# Patient Record
Sex: Male | Born: 1950
Health system: Southern US, Community
[De-identification: ages and names within clinical notes are randomized; demographics above are authoritative.]

## PROBLEM LIST (undated history)

## (undated) DIAGNOSIS — S46009A Unspecified injury of muscle(s) and tendon(s) of the rotator cuff of unspecified shoulder, initial encounter: Secondary | ICD-10-CM

## (undated) DIAGNOSIS — Z86010 Personal history of colonic polyps: Secondary | ICD-10-CM

## (undated) DIAGNOSIS — M778 Other enthesopathies, not elsewhere classified: Secondary | ICD-10-CM

## (undated) DIAGNOSIS — N341 Nonspecific urethritis: Secondary | ICD-10-CM

## (undated) DIAGNOSIS — M7030 Other bursitis of elbow, unspecified elbow: Secondary | ICD-10-CM

## (undated) HISTORY — DX: Nonspecific urethritis: N34.1

## (undated) HISTORY — PX: COLONOSCOPY: SHX174

## (undated) HISTORY — DX: Personal history of colonic polyps: Z86.010

## (undated) HISTORY — DX: Unspecified injury of muscle(s) and tendon(s) of the rotator cuff of unspecified shoulder, initial encounter: S46.009A

## (undated) HISTORY — DX: Other enthesopathies, not elsewhere classified: M77.8

## (undated) HISTORY — PX: CATARACT EXTRACTION, BILATERAL: SHX1313

---
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
## 2004-02-04 ENCOUNTER — Ambulatory Visit (HOSPITAL_COMMUNITY): Admission: RE | Admit: 2004-02-04 | Discharge: 2004-02-04 | Payer: Self-pay | Admitting: Internal Medicine

## 2004-12-02 ENCOUNTER — Ambulatory Visit: Payer: Self-pay | Admitting: Internal Medicine

## 2005-01-10 ENCOUNTER — Ambulatory Visit: Payer: Self-pay | Admitting: Internal Medicine

## 2005-11-29 ENCOUNTER — Ambulatory Visit: Payer: Self-pay | Admitting: Gastroenterology

## 2005-12-06 ENCOUNTER — Ambulatory Visit: Payer: Self-pay | Admitting: Gastroenterology

## 2006-09-26 ENCOUNTER — Ambulatory Visit: Payer: Self-pay | Admitting: Internal Medicine

## 2006-09-26 LAB — CONVERTED CEMR LAB
ALT: 19 units/L (ref 0–40)
Alkaline Phosphatase: 65 units/L (ref 39–117)
Basophils Absolute: 0.1 10*3/uL (ref 0.0–0.1)
Basophils Relative: 0.9 % (ref 0.0–1.0)
CO2: 25 meq/L (ref 19–32)
Chloride: 107 meq/L (ref 96–112)
GFR calc non Af Amer: 60 mL/min
Glomerular Filtration Rate, Af Am: 73 mL/min/{1.73_m2}
Glucose, Bld: 105 mg/dL — ABNORMAL HIGH (ref 70–99)
Ketones, ur: NEGATIVE mg/dL
LDL Cholesterol: 136 mg/dL — ABNORMAL HIGH (ref 0–99)
Leukocytes, UA: NEGATIVE
Lymphocytes Relative: 23.3 % (ref 12.0–46.0)
MCV: 93 fL (ref 78.0–100.0)
Monocytes Absolute: 0.5 10*3/uL (ref 0.2–0.7)
Monocytes Relative: 6.1 % (ref 3.0–11.0)
Platelets: 248 10*3/uL (ref 150–400)
RBC: 4.42 M/uL (ref 4.22–5.81)
Sodium: 139 meq/L (ref 135–145)
Specific Gravity, Urine: 1.025 (ref 1.000–1.03)
TSH: 1.1 microintl units/mL (ref 0.35–5.50)
Total Protein, Urine: NEGATIVE mg/dL
Total Protein: 6.8 g/dL (ref 6.0–8.3)
Urobilinogen, UA: 0.2 (ref 0.0–1.0)
VLDL: 16 mg/dL (ref 0–40)
WBC: 8.1 10*3/uL (ref 4.5–10.5)
pH: 6 (ref 5.0–8.0)

## 2006-10-09 ENCOUNTER — Ambulatory Visit: Payer: Self-pay | Admitting: Internal Medicine

## 2006-11-06 HISTORY — PX: ROTATOR CUFF REPAIR: SHX139

## 2006-11-08 ENCOUNTER — Encounter: Admission: RE | Admit: 2006-11-08 | Discharge: 2006-11-08 | Payer: Self-pay | Admitting: Internal Medicine

## 2009-07-13 ENCOUNTER — Ambulatory Visit: Payer: Self-pay | Admitting: Internal Medicine

## 2009-07-13 LAB — CONVERTED CEMR LAB
ALT: 26 units/L (ref 0–53)
Albumin: 3.8 g/dL (ref 3.5–5.2)
Basophils Relative: 0.4 % (ref 0.0–3.0)
Bilirubin Urine: NEGATIVE
Bilirubin, Direct: 0.1 mg/dL (ref 0.0–0.3)
CO2: 26 meq/L (ref 19–32)
Chloride: 107 meq/L (ref 96–112)
Creatinine, Ser: 1.3 mg/dL (ref 0.4–1.5)
Eosinophils Absolute: 0.2 10*3/uL (ref 0.0–0.7)
Eosinophils Relative: 3.1 % (ref 0.0–5.0)
HCT: 43.3 % (ref 39.0–52.0)
Hemoglobin, Urine: NEGATIVE
Hemoglobin: 15.1 g/dL (ref 13.0–17.0)
LDL Cholesterol: 124 mg/dL — ABNORMAL HIGH (ref 0–99)
MCHC: 34.7 g/dL (ref 30.0–36.0)
MCV: 94.5 fL (ref 78.0–100.0)
Monocytes Absolute: 0.6 10*3/uL (ref 0.1–1.0)
Neutro Abs: 3.3 10*3/uL (ref 1.4–7.7)
Neutrophils Relative %: 50.8 % (ref 43.0–77.0)
Nitrite: NEGATIVE
Potassium: 4.7 meq/L (ref 3.5–5.1)
RBC: 4.59 M/uL (ref 4.22–5.81)
Sodium: 141 meq/L (ref 135–145)
Total CHOL/HDL Ratio: 5
Total Protein, Urine: NEGATIVE mg/dL
Total Protein: 6.8 g/dL (ref 6.0–8.3)
Triglycerides: 163 mg/dL — ABNORMAL HIGH (ref 0.0–149.0)
Urine Glucose: NEGATIVE mg/dL
Urobilinogen, UA: 0.2 (ref 0.0–1.0)
WBC: 6.4 10*3/uL (ref 4.5–10.5)

## 2009-07-14 ENCOUNTER — Encounter: Payer: Self-pay | Admitting: Internal Medicine

## 2009-07-15 ENCOUNTER — Ambulatory Visit: Payer: Self-pay | Admitting: Internal Medicine

## 2009-07-16 DIAGNOSIS — M67919 Unspecified disorder of synovium and tendon, unspecified shoulder: Secondary | ICD-10-CM | POA: Insufficient documentation

## 2009-07-16 DIAGNOSIS — N341 Nonspecific urethritis: Secondary | ICD-10-CM | POA: Insufficient documentation

## 2009-07-16 DIAGNOSIS — M659 Unspecified synovitis and tenosynovitis, unspecified site: Secondary | ICD-10-CM | POA: Insufficient documentation

## 2009-07-16 DIAGNOSIS — M719 Bursopathy, unspecified: Secondary | ICD-10-CM

## 2009-07-16 DIAGNOSIS — Z872 Personal history of diseases of the skin and subcutaneous tissue: Secondary | ICD-10-CM | POA: Insufficient documentation

## 2009-08-18 ENCOUNTER — Ambulatory Visit: Payer: Self-pay | Admitting: Internal Medicine

## 2009-08-18 DIAGNOSIS — F322 Major depressive disorder, single episode, severe without psychotic features: Secondary | ICD-10-CM | POA: Insufficient documentation

## 2009-08-18 DIAGNOSIS — R51 Headache: Secondary | ICD-10-CM

## 2009-08-18 DIAGNOSIS — R519 Headache, unspecified: Secondary | ICD-10-CM | POA: Insufficient documentation

## 2009-08-20 ENCOUNTER — Encounter: Admission: RE | Admit: 2009-08-20 | Discharge: 2009-08-20 | Payer: Self-pay | Admitting: Internal Medicine

## 2009-08-21 ENCOUNTER — Ambulatory Visit: Payer: Self-pay | Admitting: Family Medicine

## 2009-08-21 DIAGNOSIS — J069 Acute upper respiratory infection, unspecified: Secondary | ICD-10-CM | POA: Insufficient documentation
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
## 2010-11-16 ENCOUNTER — Encounter: Payer: Self-pay | Admitting: Gastroenterology

## 2010-12-08 NOTE — Letter (Signed)
Summary: Colonoscopy Letter  [facility]  [address] [place], [place] [address]   Phone: [phone]  Fax: [phone]      November 16, 2010 MRN: [id number]   Richard Novak [address] [place], [place]  [address]   Dear Mr. Greeno,   According to your medical record, it is time for you to schedule a Colonoscopy. The American Cancer Society recommends this procedure as a method to detect early colon cancer. Patients with a family history of colon cancer, or a personal history of colon polyps or inflammatory bowel disease are at increased risk.  This letter has been generated based on the recommendations made at the time of your procedure. If you feel that in your particular situation this may no longer apply, please contact our office.  Please call our office at [phone] to schedule this appointment or to update your records at your earliest convenience.  Thank you for cooperating with [place] to provide you with the very best care possible.   Sincerely,  Judie Petit T. Russella Dar, M.D.  [facility] [phone]

## 2011-03-24 NOTE — Letter (Signed)
November 14, 2006    Mark C. Ophelia Charter, M.D.  [address]  [place], [place] [address]   RE:  Richard Novak, Richard Novak  MRN:  [id number]  /  DOB:  [birth year]   Dear Loraine Leriche:   Thank you very much for seeing [name] for evaluation of chronic  shoulder pain.   I saw Mr. Plain as a new patient October 09, 2006 and I have enclosed my  new patient H&P.   The patient at that time was complaining of significant pain and  discomfort in his left shoulder.  He had taken a fall back in July of  2006, landing on his left elbow and stoving up his whole right arm and  neck.  Since that time, he has had decrease range of motion with  abduction.  Good range of motion with flexion and extension, but ongoing  pain and discomfort.   The patient came to MRI of his shoulder on November 08, 2006, which  revealed the patient to have significant rotator cuff tendinoplasty  tendinosis with shallow articular tears, but no discrete full-thickness  rotator cuff tear.  He had posterior superior and anterior inferior  labral tears and degeneration, and some mild subacromial or subdeltoid  bursitis.   I appreciate your assistance in evaluation of this nice patient.  If I  can provide any additional information, please do not hesitate to  contact me.   I look forward to hearing from you in regards to a final disposition for  [name].  Thanks for your help.    Sincerely,      Rosalyn Gess. Norins, MD  Electronically Signed    MEN/MedQ  DD: 11/14/2006  DT: 11/14/2006  Job #: [id number]

## 2011-03-24 NOTE — Assessment & Plan Note (Signed)
[facility]                           PRIMARY CARE OFFICE NOTE   Richard Novak, GOETHE                        MRN:          [id number]  DATE:10/09/2006                            DOB:          Jul 11, 1951    Richard Novak is a very pleasant 60 year old Caucasian gentleman who presents  to establish for ongoing continuity of care.  He was last seen for a  physical exam on January 10, 2005.  Please see that complete dictation for  past medical history, family history and social history.  The patient  was seen September 26, 2006 for a sinus infection and treated with  azithromycin with good results.   CHIEF COMPLAINTS:  1. Shoulder pain.  The patient reports he took a fall in July of 2006      and fell, landing on his left elbow and stoving up his whole      right arm and neck.  Since that time, he has had decreased range of      motion with abduction, although he has good range of motion with      flexion and extension.  He also has some limitation in rotation of      his neck, particularly to the left.  2. Increased urinary odor and color.  3. Development of a tan skin lesion in the left infraorbital area, as      well as multiple tan lesions, particularly on his lower      extremities.   CURRENT MEDICATIONS:  No prescription drugs on a regular basis.  1. He does take Levitra 10 mg p.r.n.  2. Tylenol on a p.r.n. basis.  3. Advil on a p.r.n. basis.   REVIEW OF SYSTEMS:  Negative for any constitutional problems, except for  mild sleep duration insomnia, which occurs 2-3 times a month.  No  opthalmic problems, but no exam for more than 24 months.  No ENT,  cardiovascular or respiratory problems.  Occasional heartburn treated  with Pepcid AC.  Occasional hemorrhoids, but no active bleeding.  No GU  problems, except for occasional nocturia x1-2, and no musculoskeletal,  dermatologic or neurologic complaints.   PHYSICAL EXAMINATION:  VITAL SIGNS:  The patient is  afebrile.  Blood  pressure 113/73, pulse 76, weight 201.  GENERAL APPEARANCE.  This is a well-nourished, well-developed Caucasian  gentleman who looks his stated age, in no acute distress.  HEENT:  Normocephalic and atraumatic.  EACs and TMs were unremarkable.  Oropharynx with native dentition in good repair.  No buccal or palatal  lesions were noted.  Posterior pharynx was clear.  Conjunctivae and  sclerae were clear.  Pupils equal, round and reactive to light and  accommodation.  Extraocular movements intact.  Funduscopic exam was  unremarkable.  NECK:  Supple without thyromegaly.  NODES:  No adenopathy was noted in the cervical, supraclavicular or  axillary regions.  CHEST:  No CVA tenderness.  LUNGS:  Clear to auscultation and percussion.  CARDIOVASCULAR:  There were 2+ radial pulses.  No JVD or carotid bruits.  He had a quiet precordium with a  regular rate and rhythm without  murmurs, rubs, or gallops.  ABDOMEN:  Soft.  No guarding or rebound.  No organosplenomegaly was  noted.  There was no guarding, no rebound, no tenderness.  GENITALIA:  Normal male phallus bilaterally.  Descended testicles  without masses.  RECTAL:  Normal sphincter tone was noted.  Prostate was smooth, normal  in size and contour without nodules.  EXTREMITIES:  Without clubbing, cyanosis, edema or deformity.  NEUROLOGIC:  No neurologic abnormalities were noted.   DATABASE:  A 12-lead electrocardiogram was a normal sinus rhythm with no  abnormalities noted.   LABORATORY DATA:  Hemoglobin was 14 gm.  White count was 8100 with a  normal differential.  Chemistries were unremarkable with a serum glucose  of 105.  Kidney function normal with a creatinine of 1.3 and a GFR of  60.  Liver functions were normal.  Cholesterol was 191, triglycerides  80, HDL 38.8, LDL 136.  Thyroid function normal with a TSH of 1.10.  PSA  was normal at 0.57.  Urinalysis was slightly concentrated with a  specific gravity of  1.025.   ASSESSMENT AND PLAN:  1. Concentrated urine.  Suspect the patient is just mildly behind in      fluid intake with no need for any further evaluation.  2. Musculoskeletal.  On examination, the patient did have limitation      in abduction to 90 degrees, then limited by pain and discomfort.      There is no crepitus in the joint.  Would be concerned for possible      torn rotator cuff.  Plan - the patient to have an MRI of the      shoulder, and if there is no derangement, would refer to physical      therapy.  If there is derangement, would refer to orthopedics.  3. Dermatologic.  The patient has a macular tan lesion in the      infraorbital area on the left which does not appear worrisome.      This needs to just be watched for change in size or color.  Other      skin lesions were also noted to be benign macular-appearing      lesions.  4. Health maintenance.  The patient did undergo colonoscopy in December 06, 2005, which was a normal study, except for internal      hemorrhoids, with the patient due to a followup study in 2012.  The      patient's last chest x-ray on February 04, 2004 showed no evidence of      active chest disease, and the patient is a nonsmoker.   In summary, this is a very pleasant gentleman with shoulder problems as  noted above; otherwise, in good health.  He is asked to return to see me  in 2 years for a routine physical exam; otherwise, I will see him on a  p.r.n. basis.     Rosalyn Gess Norins, MD  Electronically Signed    MEN/MedQ  DD: 10/10/2006  DT: 10/10/2006  Job #: [id number]   cc:   [name]

## 2011-05-12 ENCOUNTER — Encounter: Payer: Self-pay | Admitting: Internal Medicine

## 2011-05-15 ENCOUNTER — Ambulatory Visit (INDEPENDENT_AMBULATORY_CARE_PROVIDER_SITE_OTHER): Payer: PRIVATE HEALTH INSURANCE | Admitting: Internal Medicine

## 2011-05-15 VITALS — BP 112/80 | HR 104 | Temp 98.5°F | Wt 198.0 lb

## 2011-05-15 DIAGNOSIS — S76219A Strain of adductor muscle, fascia and tendon of unspecified thigh, initial encounter: Secondary | ICD-10-CM

## 2011-05-15 DIAGNOSIS — IMO0002 Reserved for concepts with insufficient information to code with codable children: Secondary | ICD-10-CM

## 2011-05-15 NOTE — Progress Notes (Signed)
  Subjective:    Patient ID: Richard Novak, male    DOB: April 21, 1951, 60 y.o.   MRN: [id number]  HPI Richard Novak presents for a several day h/o pain in the medial thigh, testicle and groin. He has been doing some lifting. The pain has migrated so that it is now in the lower abdomen. His concerns are testicular disease vs hernia.  PMH, FamHx and SocHx reviewed for any changes and relevance.    Review of Systems  Constitutional: Negative.   HENT: Negative.  Negative for facial swelling.   Eyes: Negative.   Respiratory: Negative.   Cardiovascular: Negative.   Genitourinary: Negative.  Negative for penile swelling, scrotal swelling and testicular pain.  Musculoskeletal: Negative for joint swelling.  Neurological: Negative.   Hematological: Negative.        Objective:   Physical Exam Vitals reviewed Gen'l - WNWD white male Ext - left thigh normal with no point tenderness or mass GU - testicles slightly atrophic, normal structure, no tenderness or scrotal mass.  Abd - no mass or  Bulge in the left groin or left inguinal canal.       Assessment & Plan:  Groin strain - no testicular findings no hernia  Plan - supportive care.

## 2011-06-16 ENCOUNTER — Telehealth: Payer: Self-pay

## 2011-06-16 DIAGNOSIS — G8929 Other chronic pain: Secondary | ICD-10-CM

## 2011-06-16 NOTE — Telephone Encounter (Signed)
Pt called stating he is still having lower abd pain, worse when sitting. Pt is requesting abd U/S. He has a strong family history of cancer and is extremely anxious to rule out, please advise.

## 2011-06-19 NOTE — Telephone Encounter (Signed)
For concern about interabdominal issues like malignancy will need CT abdomen/pelvis with contrast. If he is willing will schedule. May have a significant co-pay.

## 2011-06-19 NOTE — Telephone Encounter (Signed)
Left message on machine for pt to return my call regarding CT.

## 2011-06-20 ENCOUNTER — Ambulatory Visit: Payer: PRIVATE HEALTH INSURANCE

## 2011-06-20 DIAGNOSIS — Z0389 Encounter for observation for other suspected diseases and conditions ruled out: Secondary | ICD-10-CM

## 2011-06-20 DIAGNOSIS — Z0000 Encounter for general adult medical examination without abnormal findings: Secondary | ICD-10-CM

## 2011-06-20 LAB — URINALYSIS
Nitrite: NEGATIVE
Total Protein, Urine: NEGATIVE
Urine Glucose: NEGATIVE
pH: 8 (ref 5.0–8.0)

## 2011-06-20 LAB — CBC WITH DIFFERENTIAL/PLATELET
Basophils Relative: 0.6 % (ref 0.0–3.0)
Eosinophils Absolute: 0.2 10*3/uL (ref 0.0–0.7)
Eosinophils Relative: 3.2 % (ref 0.0–5.0)
Lymphocytes Relative: 29.1 % (ref 12.0–46.0)
Monocytes Relative: 7.1 % (ref 3.0–12.0)
Neutrophils Relative %: 60 % (ref 43.0–77.0)
RBC: 4.94 Mil/uL (ref 4.22–5.81)
WBC: 6.1 10*3/uL (ref 4.5–10.5)

## 2011-06-20 LAB — BASIC METABOLIC PANEL
BUN: 19 mg/dL (ref 6–23)
Chloride: 102 mEq/L (ref 96–112)
Potassium: 4.9 mEq/L (ref 3.5–5.1)

## 2011-06-20 LAB — LIPID PANEL
Total CHOL/HDL Ratio: 4
VLDL: 13.4 mg/dL (ref 0.0–40.0)

## 2011-06-20 LAB — HEPATIC FUNCTION PANEL
AST: 22 U/L (ref 0–37)
Alkaline Phosphatase: 54 U/L (ref 39–117)
Total Bilirubin: 0.6 mg/dL (ref 0.3–1.2)

## 2011-06-20 LAB — PSA: PSA: 0.62 ng/mL (ref 0.10–4.00)

## 2011-06-20 LAB — LDL CHOLESTEROL, DIRECT: Direct LDL: 143.4 mg/dL

## 2011-06-20 NOTE — Telephone Encounter (Signed)
Pt came into the office this morning. He is aware that he will have a high deductable. Pt would like to go ahead and set up CT of Abdomin and pelvis

## 2011-06-20 NOTE — Telephone Encounter (Signed)
Patient notified per MD and advised [facility] to contact with appt information

## 2011-06-20 NOTE — Telephone Encounter (Signed)
K. Order entered

## 2011-06-26 ENCOUNTER — Ambulatory Visit (INDEPENDENT_AMBULATORY_CARE_PROVIDER_SITE_OTHER)
Admission: RE | Admit: 2011-06-26 | Discharge: 2011-06-26 | Disposition: A | Discharge status: Home / Self Care | Payer: PRIVATE HEALTH INSURANCE | Source: Ambulatory Visit | Attending: Internal Medicine | Admitting: Internal Medicine

## 2011-06-26 DIAGNOSIS — R109 Unspecified abdominal pain: Secondary | ICD-10-CM

## 2011-06-26 DIAGNOSIS — G8929 Other chronic pain: Secondary | ICD-10-CM

## 2011-06-26 MED ORDER — IOHEXOL 300 MG/ML  SOLN
100.0000 mL | Freq: Once | INTRAMUSCULAR | Status: AC | PRN
  Administered 2011-06-26: 100 mL via INTRAVENOUS

## 2011-06-27 ENCOUNTER — Ambulatory Visit (INDEPENDENT_AMBULATORY_CARE_PROVIDER_SITE_OTHER): Payer: PRIVATE HEALTH INSURANCE | Admitting: Internal Medicine

## 2011-06-27 ENCOUNTER — Encounter: Payer: Self-pay | Admitting: Internal Medicine

## 2011-06-27 VITALS — BP 104/64 | HR 104 | Temp 97.7°F | Wt 197.0 lb

## 2011-06-27 DIAGNOSIS — Z136 Encounter for screening for cardiovascular disorders: Secondary | ICD-10-CM

## 2011-06-27 DIAGNOSIS — R7989 Other specified abnormal findings of blood chemistry: Secondary | ICD-10-CM

## 2011-06-27 DIAGNOSIS — Z0000 Encounter for general adult medical examination without abnormal findings: Secondary | ICD-10-CM
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
## 2011-06-29 DIAGNOSIS — Z0000 Encounter for general adult medical examination without abnormal findings: Secondary | ICD-10-CM | POA: Insufficient documentation

## 2011-06-29 NOTE — Assessment & Plan Note (Addendum)
Interval medical history significant for lower abdominal discomfort with normal lab and a negative CT abdomen/pelvis. Physical exam is normal with no findings to explain his discomfort. Lab results are in normal limits except for a mildly elevated LDL cholesterol at 146, previously 124. This can be addressed by life-style management with a low fat diet and regular exercise. Follow-up lab in 6 months. He is current with colorectal cancer screening with last exam in Jan '07. Immunizations: due for tetanu, pneumonia and shingles shots. 12 lead EKG reveals mild tachycardia with no evidence of ischemia or injury. PSA was normal.  In summary - a pleasant gentleman who appears to be in good health. He will return for lab in 6 months to monitor cholesterol levels. He will return if his abdominal symptoms progress, otherwise he will return in 1 year.

## 2012-07-22 ENCOUNTER — Encounter: Payer: Self-pay | Admitting: Gastroenterology

## 2013-02-27 ENCOUNTER — Encounter: Payer: Self-pay | Admitting: Internal Medicine

## 2013-03-11 ENCOUNTER — Encounter: Payer: PRIVATE HEALTH INSURANCE | Admitting: Internal Medicine

## 2013-03-14 ENCOUNTER — Ambulatory Visit (AMBULATORY_SURGERY_CENTER): Payer: PRIVATE HEALTH INSURANCE

## 2013-03-14 VITALS — Ht 71.0 in | Wt 216.6 lb

## 2013-03-14 DIAGNOSIS — Z1211 Encounter for screening for malignant neoplasm of colon: Secondary | ICD-10-CM

## 2013-03-14 DIAGNOSIS — Z86010 Personal history of colonic polyps: Secondary | ICD-10-CM

## 2013-03-14 MED ORDER — NA SULFATE-K SULFATE-MG SULF 17.5-3.13-1.6 GM/177ML PO SOLN: 1.0000 | Freq: Once | ORAL | Status: DC

## 2013-03-17 ENCOUNTER — Encounter: Payer: Self-pay | Admitting: Internal Medicine

## 2013-03-18 ENCOUNTER — Encounter: Payer: Self-pay | Admitting: Internal Medicine

## 2013-03-18 ENCOUNTER — Ambulatory Visit (AMBULATORY_SURGERY_CENTER): Payer: PRIVATE HEALTH INSURANCE | Admitting: Internal Medicine

## 2013-03-18 VITALS — BP 126/62 | HR 73 | Temp 97.7°F | Resp 17 | Ht 71.0 in | Wt 216.0 lb

## 2013-03-18 DIAGNOSIS — K5730 Diverticulosis of large intestine without perforation or abscess without bleeding: Secondary | ICD-10-CM

## 2013-03-18 DIAGNOSIS — D126 Benign neoplasm of colon, unspecified: Secondary | ICD-10-CM

## 2013-03-18 DIAGNOSIS — Z860100 Personal history of colon polyps, unspecified: Secondary | ICD-10-CM | POA: Insufficient documentation

## 2013-03-18 DIAGNOSIS — K648 Other hemorrhoids: Secondary | ICD-10-CM

## 2013-03-18 DIAGNOSIS — K644 Residual hemorrhoidal skin tags: Secondary | ICD-10-CM

## 2013-03-18 DIAGNOSIS — Z1211 Encounter for screening for malignant neoplasm of colon: Secondary | ICD-10-CM

## 2013-03-18 HISTORY — DX: Personal history of colonic polyps: Z86.010

## 2013-03-18 HISTORY — DX: Personal history of colon polyps, unspecified: Z86.0100

## 2013-03-18 MED ORDER — SODIUM CHLORIDE 0.9 % IV SOLN: 500.0000 mL | INTRAVENOUS | Status: DC

## 2013-03-18 NOTE — Progress Notes (Signed)
Called to room to assist during endoscopic procedure.  Patient ID and intended procedure confirmed with present staff. Received instructions for my participation in the procedure from the performing physician.  

## 2013-03-18 NOTE — Progress Notes (Signed)
Patient did not experience any of the following events: a burn prior to discharge; a fall within the facility; wrong site/side/patient/procedure/implant event; or a hospital transfer or hospital admission upon discharge from the facility. (G8907) Patient did not have preoperative order for IV antibiotic SSI prophylaxis. (G8918)  

## 2013-03-18 NOTE — Patient Instructions (Addendum)
Two benign appearing polyps were removed.  You also have diverticulosis and hemorrhoids.  I will let you know pathology results and when to have another routine colonoscopy by mail.  If your hemorrhoids bother you please contact me through my office - I can help you with those with a procedure called hemorrhoid ligation - usually painless and quick procedure.  I appreciate the opportunity to care for you.  Iva Boop, MD, FACG   YOU HAD AN ENDOSCOPIC PROCEDURE TODAY AT THE [facility]: Refer to the procedure report that was given to you for any specific questions about what was found during the examination.  If the procedure report does not answer your questions, please call your gastroenterologist to clarify.  If you requested that your care partner not be given the details of your procedure findings, then the procedure report has been included in a sealed envelope for you to review at your convenience later.  YOU SHOULD EXPECT: Some feelings of bloating in the abdomen. Passage of more gas than usual.  Walking can help get rid of the air that was put into your GI tract during the procedure and reduce the bloating. If you had a lower endoscopy (such as a colonoscopy or flexible sigmoidoscopy) you may notice spotting of blood in your stool or on the toilet paper. If you underwent a bowel prep for your procedure, then you may not have a normal bowel movement for a few days.  DIET: Your first meal following the procedure should be a light meal and then it is ok to progress to your normal diet.  A half-sandwich or bowl of soup is an example of a good first meal.  Heavy or fried foods are harder to digest and may make you feel nauseous or bloated.  Likewise meals heavy in dairy and vegetables can cause extra gas to form and this can also increase the bloating.  Drink plenty of fluids but you should avoid alcoholic beverages for 24 hours.  ACTIVITY: Your care partner should take you  home directly after the procedure.  You should plan to take it easy, moving slowly for the rest of the day.  You can resume normal activity the day after the procedure however you should NOT DRIVE or use heavy machinery for 24 hours (because of the sedation medicines used during the test).    SYMPTOMS TO REPORT IMMEDIATELY: A gastroenterologist can be reached at any hour.  During normal business hours, 8:30 AM to 5:00 PM Monday through Friday, call [phone].  After hours and on weekends, please call the GI answering service at [phone] who will take a message and have the physician on call contact you.   Following lower endoscopy (colonoscopy or flexible sigmoidoscopy):  Excessive amounts of blood in the stool  Significant tenderness or worsening of abdominal pains  Swelling of the abdomen that is new, acute  Fever of 100F or higher   FOLLOW UP: If any biopsies were taken you will be contacted by phone or by letter within the next 1-3 weeks.  Call your gastroenterologist if you have not heard about the biopsies in 3 weeks.  Our staff will call the home number listed on your records the next business day following your procedure to check on you and address any questions or concerns that you may have at that time regarding the information given to you following your procedure. This is a courtesy call and so if there is no answer at the  home number and we have not heard from you through the emergency physician on call, we will assume that you have returned to your regular daily activities without incident.  SIGNATURES/CONFIDENTIALITY: You and/or your care partner have signed paperwork which will be entered into your electronic medical record.  These signatures attest to the fact that that the information above on your After Visit Summary has been reviewed and is understood.  Full responsibility of the confidentiality of this discharge information lies with you and/or your  care-partner.  Please hold aspirin, aspirin products, and anti-inflammatory meds for one week.    Gave polyp, diverticulosis, hemorrhoid information.

## 2013-03-18 NOTE — Op Note (Signed)
[facility] [address] [place], [address]   COLONOSCOPY PROCEDURE REPORT  PATIENT: Richard Novak, Richard Novak  MR#: [id number] BIRTHDATE: May 13, 1951 , 61  yrs. old GENDER: Male ENDOSCOPIST: Iva Boop, MD, [facility] PROCEDURE DATE:  03/18/2013 PROCEDURE:   Colonoscopy with snare polypectomy ASA CLASS:   Class II INDICATIONS:Screening and surveillance,personal history of colonic polyps. MEDICATIONS: propofol (Diprivan) 350mg  IV, MAC sedation, administered by CRNA, and These medications were titrated to patient response per physician's verbal order  DESCRIPTION OF PROCEDURE:   After the risks benefits and alternatives of the procedure were thoroughly explained, informed consent was obtained.  A digital rectal exam revealed no abnormalities of the rectum, A digital rectal exam revealed the prostate was not enlarged, and A digital rectal exam revealed no prostatic nodules.   The LB CF-[id number]  endoscope was introduced through the anus and advanced to the cecum, which was identified by both the appendix and ileocecal valve. No adverse events experienced.   The quality of the prep was excellent using Suprep  The instrument was then slowly withdrawn as the colon was fully examined.    COLON FINDINGS: Two polypoid shaped polyps measuring 5 mm (sessile) and 8-10 mm (pedunculated)in size were found in the sigmoid colon. A polypectomy was performed with a cold snare (5mm) and using snare cautery (8-55mm).  The resection was complete and the polyp tissue was completely retrieved.   Moderate diverticulosis was noted in the sigmoid colon.   Small internal and external hemorrhoids were found.   The colon mucosa was otherwise normal.  Retroflexed views revealed internal/external hemorrhoids. The time to cecum=2 minutes 22 seconds.  Withdrawal time=12 minutes 27 seconds.  The scope was withdrawn and the procedure completed. COMPLICATIONS: There were no  complications.  ENDOSCOPIC IMPRESSION: 1.   Two pedunculated polyps measuring 8-10 mm in size were found in the sigmoid colon; polypectomy was performed with a cold snare and using snare cautery 2.   Moderate diverticulosis was noted in the sigmoid colon 3.   Small internal and external hemorrhoids 4.   The colon mucosa was otherwise normal - excellent prep  RECOMMENDATIONS: 1.  Timing of repeat colonoscopy will be determined by pathology findings in patient w/ 3 diminutive adenomas 2004 and none in 2007 2.  Hold aspirin, aspirin products, and anti-inflammatory medication for 1 week.  eSigned:  Iva Boop, MD, [facility] 03/18/2013 9:07 AM   cc: The Patient

## 2013-03-19 ENCOUNTER — Telehealth: Payer: Self-pay | Admitting: *Deleted

## 2013-03-19 NOTE — Telephone Encounter (Signed)
  Follow up Call-  Call back number 03/18/2013  Post procedure Call Back phone  # [phone]  Permission to leave phone message Yes     Patient questions:  Do you have a fever, pain , or abdominal swelling? no Pain Score  0 *  Have you tolerated food without any problems? yes  Have you been able to return to your normal activities? yes  Do you have any questions about your discharge instructions: Diet   no Medications  no Follow up visit  no  Do you have questions or concerns about your Care? no  Actions: * If pain score is 4 or above: No action needed, pain <4.

## 2013-03-24 ENCOUNTER — Encounter: Payer: Self-pay | Admitting: Internal Medicine

## 2013-03-24 NOTE — Progress Notes (Signed)
Quick Note:  8mm TV adenoma and a 5 mm hyperplastic polyp (both sigmoid) Repeat colonoscopy 5 years ______

## 2014-01-08 ENCOUNTER — Ambulatory Visit (INDEPENDENT_AMBULATORY_CARE_PROVIDER_SITE_OTHER): Payer: Self-pay | Admitting: Internal Medicine

## 2014-01-08 ENCOUNTER — Encounter: Payer: Self-pay | Admitting: Internal Medicine

## 2014-01-08 VITALS — BP 114/70 | HR 116 | Temp 99.9°F | Wt 217.0 lb

## 2014-01-08 DIAGNOSIS — J019 Acute sinusitis, unspecified: Secondary | ICD-10-CM

## 2014-01-08 DIAGNOSIS — H6120 Impacted cerumen, unspecified ear: Secondary | ICD-10-CM

## 2014-01-08 MED ORDER — AMOXICILLIN-POT CLAVULANATE 875-125 MG PO TABS: 1.0000 | ORAL_TABLET | Freq: Two times a day (BID) | ORAL | Status: DC

## 2014-01-08 NOTE — Progress Notes (Signed)
Pre visit review using our clinic review tool, if applicable. No additional management support is needed unless otherwise documented below in the visit note. 

## 2014-01-08 NOTE — Progress Notes (Signed)
Subjective:    Patient ID: Richard Novak, male    DOB: 1951/06/17, 63 y.o.   MRN: [id number]  HPI Two sweeks ago he had a scratchy throat which progressed to full blown sore throat with swollen glands. Started otc chloraseptic and APAP, ibuprofen. 1 week ago started sudafed but it made it worse with pink mucus. He feels it in his ears. He has had intermittent fevers.  Past Medical History  Diagnosis Date  . Rotator cuff injury     Left  . Tendinitis of left elbow   . Urticaria   . Nongonococcal urethritis (NGU) due to other specified organism   . Personal history of colonic adenomas 03/18/2013   Past Surgical History  Procedure Laterality Date  . Appendectomy  1968  . Rotator cuff repair  2008    Arthroscopic Repair ([facility])   Family History  Problem Relation Age of Onset  . Hypertension Mother   . Dementia Mother   . Prostate cancer Father   . Coronary artery disease Father   . Cancer Father     lung cancer  . Heart disease Father     CAD/CABG  . Diabetes Neg Hx   . Heart attack Neg Hx   . Colon cancer Neg Hx   . Hyperlipidemia Neg Hx    History   Social History  . Marital Status: Married    Spouse Name: N/A    Number of Children: 2  . Years of Education: 14   Occupational History  . 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     Social History Main Topics  . Smoking status: Former Smoker    Types: Cigarettes    Quit date: 06/26/2002  . Smokeless tobacco: Never Used  . Alcohol Use: 2.5 oz/week    5 drink(s) per week  . Drug Use: No  . Sexual Activity: Yes    Partners: Female   Other Topics Concern  . Not on file   Social History Narrative   HSG, 2 yrs [employer]. Married '71. 2 daughters - '74, '77; 2 grandchildren. Marriage in [place]. Retired -but working part-time for [employer]. POA wife. Full resuscitation, short-term mechanical ventilation.                 Current  Outpatient Prescriptions on File Prior to Visit  Medication Sig Dispense Refill  . B Complex-Biotin-FA (B-50 COMPLEX PO) Take by mouth.        . calcium gluconate 500 MG tablet Take 500 mg by mouth daily.        . Cholecalciferol (VITAMIN D-3 PO) Take 50 mcg by mouth daily.      . Flax Oil-Fish Oil-Borage Oil (FISH OIL-FLAX OIL-BORAGE OIL) CAPS Take by mouth. Take 2 capsules bid      . L-ARGININE PO Take 900 mg by mouth.      . LECITHIN PO Take 400 mg by mouth. Take 3 capsules daily      . MULTIPLE VITAMIN PO Take 1 tablet by mouth daily.        . NON FORMULARY Beta Sitosterol with Zinc and Selenium-320/15/200 mg Take one daily      . NON FORMULARY Herbal blend for prostate-Take one capsule bid      . vitamin C (ASCORBIC ACID) 500 MG tablet Take 500 mg by mouth 2 (two) times daily.       No current  facility-administered medications on file prior to visit.      Review of Systems System review is negative for any constitutional, cardiac, pulmonary, GI or neuro symptoms or complaints other than as described in the HPI.     Objective:   Physical Exam Filed Vitals:   01/08/14 1132  BP: 114/70  Pulse: 116  Temp: 99.9 F (37.7 C)   Gen'l- WNWD man in no distress HEENT - EACs clear, TMs a little rosy, Posterior pharynx with erythema w/o exudate. Nodes - negative Cor - RRR Pulm - normal respirations, w/o wheezing or rales. Neuro - A&O x 3.        Assessment & Plan:  Sinus infection with fever and pain.  Plan  Augmentin 875 mg twice a day for 7 days  Nasal saline spray  Vaporizer - stove top steam  OK to continue the sudafed if it helps  OK to use Tylenol for pain   Ear wax - left ear is blocked.  Plan - Ear wax removal aide - drops, put in ear, let dwell 10 minutes  Using a bulb syringe irrigate with warm water.  No Q-tips - this will pack the wax in tighter.

## 2014-01-08 NOTE — Patient Instructions (Signed)
Sinus infection with fever and pain.  Plan  Augmentin 875 mg twice a day for 7 days  Nasal saline spray  Vaporizer - stove top steam  OK to continue the sudafed if it helps  OK to use Tylenol for pain   Ear wax - left ear is blocked.  Plan - Ear wax removal aide - drops, put in ear, let dwell 10 minutes  Using a bulb syringe irrigate with warm water.  No Q-tips - this will pack the wax in tighter.

## 2014-01-17 ENCOUNTER — Other Ambulatory Visit: Payer: Self-pay | Admitting: Internal Medicine

## 2014-01-19 NOTE — Telephone Encounter (Signed)
Called patient - still with intermittent fevers, sinus pressure, drainage more bright yellow, no rigors  Plan Renew augmentin  If not cleared will need reevaluation with exam and possibly imaging.

## 2015-06-08 ENCOUNTER — Telehealth: Payer: Self-pay

## 2015-06-08 NOTE — Telephone Encounter (Signed)
Left VM regarding AWV; Stated that this appears to be his 1st year on Medicare, if he is on medicare and that this will be his first Welcome to Medicare visit and this will be done by Terri Piedra at his scheduled apt at 8:30

## 2015-06-09 ENCOUNTER — Other Ambulatory Visit (INDEPENDENT_AMBULATORY_CARE_PROVIDER_SITE_OTHER): Payer: Medicare Other

## 2015-06-09 ENCOUNTER — Ambulatory Visit (INDEPENDENT_AMBULATORY_CARE_PROVIDER_SITE_OTHER): Payer: Medicare Other | Admitting: Family

## 2015-06-09 ENCOUNTER — Encounter: Payer: Self-pay | Admitting: Family

## 2015-06-09 VITALS — BP 102/60 | HR 68 | Temp 98.4°F | Wt 183.2 lb

## 2015-06-09 DIAGNOSIS — Z23 Encounter for immunization: Secondary | ICD-10-CM | POA: Diagnosis not present

## 2015-06-09 DIAGNOSIS — E785 Hyperlipidemia, unspecified: Secondary | ICD-10-CM

## 2015-06-09 DIAGNOSIS — R972 Elevated prostate specific antigen [PSA]: Secondary | ICD-10-CM

## 2015-06-09 DIAGNOSIS — Z0000 Encounter for general adult medical examination without abnormal findings: Secondary | ICD-10-CM | POA: Insufficient documentation

## 2015-06-09 LAB — BASIC METABOLIC PANEL
BUN: 17 mg/dL (ref 6–23)
CALCIUM: 9.7 mg/dL (ref 8.4–10.5)
CHLORIDE: 102 meq/L (ref 96–112)
CO2: 29 mEq/L (ref 19–32)
Creatinine, Ser: 1.02 mg/dL (ref 0.40–1.50)
GFR: 77.73 mL/min (ref >60.00)
Glucose, Bld: 104 mg/dL — ABNORMAL HIGH (ref 70–99)
Potassium: 4.9 mEq/L (ref 3.5–5.1)
SODIUM: 139 meq/L (ref 135–145)

## 2015-06-09 LAB — CBC
HCT: 44.1 % (ref 39.0–52.0)
HEMOGLOBIN: 15.1 g/dL (ref 13.0–17.0)
MCHC: 34.1 g/dL (ref 30.0–36.0)
MCV: 94.6 fl (ref 78.0–100.0)
PLATELETS: 200 10*3/uL (ref 150.0–400.0)
RBC: 4.67 Mil/uL (ref 4.22–5.81)
RDW: 13.1 % (ref 11.5–15.5)
WBC: 7.8 10*3/uL (ref 4.0–10.5)

## 2015-06-09 LAB — LIPID PANEL
CHOL/HDL RATIO: 3
CHOLESTEROL: 175 mg/dL (ref 0–200)
HDL: 51.5 mg/dL (ref >39.00)
LDL Cholesterol: 104 mg/dL — ABNORMAL HIGH (ref 0–99)
NONHDL: 123.35
TRIGLYCERIDES: 95 mg/dL (ref 0.0–149.0)
VLDL: 19 mg/dL (ref 0.0–40.0)

## 2015-06-09 LAB — PSA: PSA: 0.74 ng/mL (ref 0.10–4.00)

## 2015-06-09 NOTE — Progress Notes (Signed)
Pre visit review using our clinic review tool, if applicable. No additional management support is needed unless otherwise documented below in the visit note. 

## 2015-06-09 NOTE — Assessment & Plan Note (Signed)
1) Anticipatory Guidance: Discussed importance of wearing a seatbelt while driving and not texting while driving; changing batteries in smoke detector at least once annually; wearing suntan lotion when outside; eating a balanced and moderate diet; getting physical activity at least 30 minutes per day.  2) Immunizations / Screenings / Labs:  Prevnar given today. Declined tetanus and Zostavax. All other immunizations are up to date per recommendations. All screenings are up to date per recommendations. Obtain CBC, BMET, Lipid profile and PSA.   Overall well exam. Minimal risk factors for cardiovascular disease present. Continue healthy lifestyle choices and behaviors. Follow up prevention exam in 1 year, follow up office visit pending lab work.   Reviewed and updated patient's medical, surgical, family and social history. Medications and allergies were also reviewed. Basic screenings for depression, activities of daily living, hearing, cognition and safety were performed. Provider list was updated and health plan was provided to the patient.

## 2015-06-09 NOTE — Progress Notes (Signed)
Subjective:    Patient ID: Richard Novak, male    DOB: August 10, 1951, 64 y.o.   MRN: [id number]  Chief Complaint  Patient presents with  . Annual Exam    HPI:  Richard Novak is a 64 y.o. male who presents today for an annual wellness visit.   1) Health Maintenance -   Diet - Averages about 3 meals per day consisting of cereal, salad, vegetables, chicken, fish, [place], red meat occasional; 2-3 cups of caffeine per day  Exercise - Walk 30 minutes 4 days per week plus ab work.   2) Preventative Exams / Immunizations:  Dental -- Up to date  Vision -- Up to date   Health Maintenance  Topic Date Due  . HIV Screening  09/10/1964  . TETANUS/TDAP  09/10/1968  . ZOSTAVAX  09/10/2009  . PNA vac Low Risk Adult (1 of 2 - PCV13) 09/10/2014  . INFLUENZA VACCINE  06/07/2015  . COLONOSCOPY  03/18/2018  Prevnar today;    There is no immunization history on file for this patient.   RISK FACTORS  Tobacco History  Smoking status  . Former Smoker -- 1.00 packs/day for 22 years  . Types: Cigarettes  . Quit date: 06/26/2002  Smokeless tobacco  . Never Used     Cardiac risk factors: advanced age (older than 43 for men, 81 for women) and male gender.  Depression Screen  Q1: Over the past two weeks, have you felt down, depressed or hopeless? No  Q2: Over the past two weeks, have you felt little interest or pleasure in doing things? No  Have you lost interest or pleasure in daily life? No  Do you often feel hopeless? No  Do you cry easily over simple problems? No  Activities of Daily Living In your present state of health, do you have any difficulty performing the following activities?:  Driving? No Managing money?  No Feeding yourself? No Getting from bed to chair? No Climbing a flight of stairs? No Preparing food and eating?: No Bathing or showering? No Getting dressed: No Getting to the toilet? No Using the toilet: No Moving around from place to place: No In the past  year have you fallen or had a near fall?:No   Home Safety Has smoke detector and wears seat belts. No firearms. No excess sun exposure. Are there smokers in your home (other than you)?  No Do you feel safe at home?  Yes   Hearing Difficulties: No Do you often ask people to speak up or repeat themselves? No Do you experience ringing or noises in your ears? No  Do you have difficulty understanding soft or whispered voices? No    Cognitive Testing  Alert? Yes   Normal Appearance? Yes  Oriented to person? Yes  Place? Yes   Time? Yes  Recall of three objects?  Yes  Can perform simple calculations? Yes  Displays appropriate judgment? Yes  Can read the correct time from a watch face? Yes  Do you feel that you have a problem with memory? No  Do you often misplace items? No   Advanced Directives have been discussed with the patient?  Already established  Current Physicians/Providers and Suppliers  1. Terri Piedra, FNP - Internal medicine  Indicate any recent Medical Services you may have received from other than [facility] providers in the past year (date may be approximate).  All answers were reviewed with the patient and necessary referrals were made:  Mauricio Po, [place]  06/09/2015    Review of Systems  Constitutional: Denies fever, chills, fatigue, or significant weight gain/loss. HENT: Head: Denies headache or neck pain Ears: Denies changes in hearing, ringing in ears, earache, drainage Nose: Denies discharge, stuffiness, itching, nosebleed, sinus pain Throat: Denies sore throat, hoarseness, dry mouth, sores, thrush Eyes: Denies loss/changes in vision, pain, redness, blurry/double vision, flashing lights Cardiovascular: Denies chest pain/discomfort, tightness, palpitations, shortness of breath with activity, difficulty lying down, swelling, sudden awakening with shortness of breath Respiratory: Denies shortness of breath, cough, sputum production, wheezing Gastrointestinal: Denies  dysphasia, heartburn, change in appetite, nausea, change in bowel habits, rectal bleeding, constipation, diarrhea, yellow skin or eyes Genitourinary: Denies frequency, urgency, burning/pain, blood in urine, incontinence, change in urinary strength. Musculoskeletal: Denies muscle/joint pain, stiffness, back pain, redness or swelling of joints, trauma Skin: Denies rashes, lumps, itching, dryness, color changes, or hair/nail changes Neurological: Denies dizziness, fainting, seizures, weakness, numbness, tingling, tremor Psychiatric - Denies nervousness, stress, depression or memory loss Endocrine: Denies heat or cold intolerance, sweating, frequent urination, excessive thirst, changes in appetite Hematologic: Denies ease of bruising or bleeding    Objective:    BP 102/60 mmHg  Pulse 68  Temp(Src) 98.4 F (36.9 C)  Wt 183 lb 4 oz (83.122 kg)  SpO2 98% Nursing note and vital signs reviewed.  Physical Exam  Constitutional: He is oriented to person, place, and time. He appears well-developed and well-nourished.  HENT:  Head: Normocephalic.  Right Ear: Hearing, tympanic membrane, external ear and ear canal normal.  Left Ear: Hearing, tympanic membrane, external ear and ear canal normal.  Nose: Nose normal.  Mouth/Throat: Uvula is midline, oropharynx is clear and moist and mucous membranes are normal.  Eyes: Conjunctivae and EOM are normal. Pupils are equal, round, and reactive to light.  Neck: Neck supple. No JVD present. No tracheal deviation present. No thyromegaly present.  Cardiovascular: Normal rate, regular rhythm, normal heart sounds and intact distal pulses.   Pulmonary/Chest: Effort normal and breath sounds normal.  Abdominal: Soft. Bowel sounds are normal. He exhibits no distension and no mass. There is no tenderness. There is no rebound and no guarding.  Musculoskeletal: Normal range of motion. He exhibits no edema or tenderness.  Lymphadenopathy:    He has no cervical  adenopathy.  Neurological: He is alert and oriented to person, place, and time. He has normal reflexes. No cranial nerve deficit. He exhibits normal muscle tone. Coordination normal.  Skin: Skin is warm and dry.  Psychiatric: He has a normal mood and affect. His behavior is normal. Judgment and thought content normal.       Assessment & Plan:   During the course of the visit the patient was educated and counseled about appropriate screening and preventive services including:    Pneumococcal vaccine   Td vaccine  Prostate cancer screening  Colorectal cancer screening  Glaucoma screening  Nutrition counseling   Diet review for nutrition referral? Yes ____  Not Indicated _X___   Patient Instructions (the written plan) was given to the patient.  Medicare Attestation I have personally reviewed: The patient's medical and social history Their use of alcohol, tobacco or illicit drugs Their current medications and supplements The patient's functional ability including ADLs,fall risks, home safety risks, cognitive, and hearing and visual impairment Diet and physical activities Evidence for depression or mood disorders  The patient's weight, height, BMI,  have been recorded in the chart.  I have made referrals, counseling, and provided education to the patient based on review  of the above and I have provided the patient with a written personalized care plan for preventive services.     Mauricio Po, FNP   06/09/2015    Problem List Items Addressed This Visit      Other   Medicare annual wellness visit, initial - Primary    1) Anticipatory Guidance: Discussed importance of wearing a seatbelt while driving and not texting while driving; changing batteries in smoke detector at least once annually; wearing suntan lotion when outside; eating a balanced and moderate diet; getting physical activity at least 30 minutes per day.  2) Immunizations / Screenings / Labs:  Prevnar given  today. Declined tetanus and Zostavax. All other immunizations are up to date per recommendations. All screenings are up to date per recommendations. Obtain CBC, BMET, Lipid profile and PSA.   Overall well exam. Minimal risk factors for cardiovascular disease present. Continue healthy lifestyle choices and behaviors. Follow up prevention exam in 1 year, follow up office visit pending lab work.   Reviewed and updated patient's medical, surgical, family and social history. Medications and allergies were also reviewed. Basic screenings for depression, activities of daily living, hearing, cognition and safety were performed. Provider list was updated and health plan was provided to the patient.         Other Visit Diagnoses    Hyperlipidemia LDL goal <100        Relevant Orders    Basic Metabolic Panel (BMET)    CBC    Lipid panel    Increasing prostate specific antigen level        Relevant Orders    PSA

## 2015-06-09 NOTE — Patient Instructions (Signed)
Thank you for choosing [facility].  Summary/Instructions:  Please stop by the lab on the basement level of the building for your blood work. Your results will be released to [place] (or called to you) after review, usually within 72 hours after test completion. If any changes need to be made, you will be notified at that same time.  If your symptoms worsen or fail to improve, please contact our office for further instruction, or in case of emergency go directly to the emergency room at the closest medical facility.   Health Maintenance  Topic Date Due  . HIV Screening  09/10/1964  . PNA vac Low Risk Adult (1 of 2 - PCV13) 09/10/2014  . INFLUENZA VACCINE  06/07/2015  . ZOSTAVAX  06/08/2016 (Originally 09/10/2009)  . TETANUS/TDAP  06/08/2016 (Originally 09/10/1968)  . COLONOSCOPY  03/18/2018    jHealth Maintenance A healthy lifestyle and preventative care can promote health and wellness.  Maintain regular health, dental, and eye exams.  Eat a healthy diet. Foods like vegetables, fruits, whole grains, low-fat dairy products, and lean protein foods contain the nutrients you need and are low in calories. Decrease your intake of foods high in solid fats, added sugars, and salt. Get information about a proper diet from your health care provider, if necessary.  Regular physical exercise is one of the most important things you can do for your health. Most adults should get at least 150 minutes of moderate-intensity exercise (any activity that increases your heart rate and causes you to sweat) each week. In addition, most adults need muscle-strengthening exercises on 2 or more days a week.   Maintain a healthy weight. The body mass index (BMI) is a screening tool to identify possible weight problems. It provides an estimate of body fat based on height and weight. Your health care provider can find your BMI and can help you achieve or maintain a healthy weight. For males 20 years and  older:  A BMI below 18.5 is considered underweight.  A BMI of 18.5 to 24.9 is normal.  A BMI of 25 to 29.9 is considered overweight.  A BMI of 30 and above is considered obese.  Maintain normal blood lipids and cholesterol by exercising and minimizing your intake of saturated fat. Eat a balanced diet with plenty of fruits and vegetables. Blood tests for lipids and cholesterol should begin at age 46 and be repeated every 5 years. If your lipid or cholesterol levels are high, you are over age 70, or you are at high risk for heart disease, you may need your cholesterol levels checked more frequently.Ongoing high lipid and cholesterol levels should be treated with medicines if diet and exercise are not working.  If you smoke, find out from your health care provider how to quit. If you do not use tobacco, do not start.  Lung cancer screening is recommended for adults aged 64-80 years who are at high risk for developing lung cancer because of a history of smoking. A yearly low-dose CT scan of the lungs is recommended for people who have at least a 30-pack-year history of smoking and are current smokers or have quit within the past 15 years. A pack year of smoking is smoking an average of 1 pack of cigarettes a day for 1 year (for example, a 30-pack-year history of smoking could mean smoking 1 pack a day for 30 years or 2 packs a day for 15 years). Yearly screening should continue until the smoker has stopped smoking for  at least 15 years. Yearly screening should be stopped for people who develop a health problem that would prevent them from having lung cancer treatment.  If you choose to drink alcohol, do not have more than 2 drinks per day. One drink is considered to be 12 oz (360 mL) of beer, 5 oz (150 mL) of wine, or 1.5 oz (45 mL) of liquor.  Avoid the use of street drugs. Do not share needles with anyone. Ask for help if you need support or instructions about stopping the use of drugs.  High  blood pressure causes heart disease and increases the risk of stroke. Blood pressure should be checked at least every 1-2 years. Ongoing high blood pressure should be treated with medicines if weight loss and exercise are not effective.  If you are 34-39 years old, ask your health care provider if you should take aspirin to prevent heart disease.  Diabetes screening involves taking a blood sample to check your fasting blood sugar level. This should be done once every 3 years after age 68 if you are at a normal weight and without risk factors for diabetes. Testing should be considered at a younger age or be carried out more frequently if you are overweight and have at least 1 risk factor for diabetes.  Colorectal cancer can be detected and often prevented. Most routine colorectal cancer screening begins at the age of 65 and continues through age 28. However, your health care provider may recommend screening at an earlier age if you have risk factors for colon cancer. On a yearly basis, your health care provider may provide home test kits to check for hidden blood in the stool. A small camera at the end of a tube may be used to directly examine the colon (sigmoidoscopy or colonoscopy) to detect the earliest forms of colorectal cancer. Talk to your health care provider about this at age 2 when routine screening begins. A direct exam of the colon should be repeated every 5-10 years through age 43, unless early forms of precancerous polyps or small growths are found.  People who are at an increased risk for hepatitis B should be screened for this virus. You are considered at high risk for hepatitis B if:  You were born in a country where hepatitis B occurs often. Talk with your health care provider about which countries are considered high risk.  Your parents were born in a high-risk country and you have not received a shot to protect against hepatitis B (hepatitis B vaccine).  You have HIV or AIDS.  You  use needles to inject street drugs.  You live with, or have sex with, someone who has hepatitis B.  You are a man who has sex with other men (MSM).  You get hemodialysis treatment.  You take certain medicines for conditions like cancer, organ transplantation, and autoimmune conditions.  Hepatitis C blood testing is recommended for all people born from [birth year] through 1967 and any individual with known risk factors for hepatitis C.  Healthy men should no longer receive prostate-specific antigen (PSA) blood tests as part of routine cancer screening. Talk to your health care provider about prostate cancer screening.  Testicular cancer screening is not recommended for adolescents or adult males who have no symptoms. Screening includes self-exam, a health care provider exam, and other screening tests. Consult with your health care provider about any symptoms you have or any concerns you have about testicular cancer.  Practice safe sex. Use condoms and avoid  high-risk sexual practices to reduce the spread of sexually transmitted infections (STIs).  You should be screened for STIs, including gonorrhea and chlamydia if:  You are sexually active and are younger than 24 years.  You are older than 24 years, and your health care provider tells you that you are at risk for this type of infection.  Your sexual activity has changed since you were last screened, and you are at an increased risk for chlamydia or gonorrhea. Ask your health care provider if you are at risk.  If you are at risk of being infected with HIV, it is recommended that you take a prescription medicine daily to prevent HIV infection. This is called pre-exposure prophylaxis (PrEP). You are considered at risk if:  You are a man who has sex with other men (MSM).  You are a heterosexual man who is sexually active with multiple partners.  You take drugs by injection.  You are sexually active with a partner who has HIV.  Talk with  your health care provider about whether you are at high risk of being infected with HIV. If you choose to begin PrEP, you should first be tested for HIV. You should then be tested every 3 months for as long as you are taking PrEP.  Use sunscreen. Apply sunscreen liberally and repeatedly throughout the day. You should seek shade when your shadow is shorter than you. Protect yourself by wearing long sleeves, pants, a wide-brimmed hat, and sunglasses year round whenever you are outdoors.  Tell your health care provider of new moles or changes in moles, especially if there is a change in shape or color. Also, tell your health care provider if a mole is larger than the size of a pencil eraser.  A one-time screening for abdominal aortic aneurysm (AAA) and surgical repair of large AAAs by ultrasound is recommended for men aged 68-75 years who are current or former smokers.  Stay current with your vaccines (immunizations). Document Released: 04/20/2008 Document Revised: 10/28/2013 Document Reviewed: 03/20/2011 [facility] Patient Information 2015 [place], [place]. This information is not intended to replace advice given to you by your health care provider. Make sure you discuss any questions you have with your health care provider.

## 2015-06-10 ENCOUNTER — Telehealth: Payer: Self-pay | Admitting: Family

## 2015-06-10 NOTE — Telephone Encounter (Signed)
Please inform patient that his blood work shows that his white/red blood cells, prostate, kidney function, cholesterol, and electrolytes are all within the normal limits. Therefore, please continue to take the dietary supplements as needed and no further medications are needed at this time. We will plan to follow-up in one year for his annual wellness visit or sooner if needed.

## 2015-06-11 NOTE — Telephone Encounter (Signed)
Pt aware of results 

## 2015-06-11 NOTE — Telephone Encounter (Signed)
Sent results in the mail per pts request

## 2015-12-30 ENCOUNTER — Telehealth: Payer: Self-pay | Admitting: Internal Medicine

## 2015-12-30 NOTE — Telephone Encounter (Signed)
Patient reports rectal pain since Sunday. Pain has improved daily and is now more of a "pressure".  He is offered an appt on Monday with Amy Esterwood PA but declines due to work.  He needs a Wed, Thurs, or Fri am.  He is advised that he should try recticare, sitz baths and Prep H suppositories until appt on 01/20/16 with Dr. Carlean Purl.  He will call me back if the recommendations don't help or his symptoms worsen.

## 2015-12-30 NOTE — Telephone Encounter (Signed)
Left message for patient to call back  

## 2016-01-03 ENCOUNTER — Ambulatory Visit: Payer: Medicare Other | Admitting: Physician Assistant

## 2016-01-20 ENCOUNTER — Ambulatory Visit: Payer: Medicare Other | Admitting: Internal Medicine

## 2016-09-04 ENCOUNTER — Telehealth (INDEPENDENT_AMBULATORY_CARE_PROVIDER_SITE_OTHER): Payer: Self-pay | Admitting: Radiology

## 2016-09-04 NOTE — Telephone Encounter (Signed)
Patient calling states that he has strained a ligament in left elbow, states there is swelling, in left arm that he had put a neoprene sleeve on it, and the swelling has moved toward his hand. Patient states that he had similar issue 7-8 yrs ago and the received an injection from Dr. Lorin Mercy for that problem. Please call pt to advise.

## 2016-09-05 NOTE — Telephone Encounter (Signed)
Cassandra was able to make patient an earlier appt with Dr. Erlinda Hong.

## 2016-09-07 ENCOUNTER — Ambulatory Visit (INDEPENDENT_AMBULATORY_CARE_PROVIDER_SITE_OTHER): Payer: Medicare Other | Admitting: Orthopaedic Surgery

## 2016-09-07 ENCOUNTER — Encounter (INDEPENDENT_AMBULATORY_CARE_PROVIDER_SITE_OTHER): Payer: Self-pay | Admitting: Orthopaedic Surgery

## 2016-09-07 DIAGNOSIS — M7022 Olecranon bursitis, left elbow: Secondary | ICD-10-CM | POA: Diagnosis not present

## 2016-09-07 DIAGNOSIS — M778 Other enthesopathies, not elsewhere classified: Secondary | ICD-10-CM | POA: Diagnosis not present

## 2016-09-07 DIAGNOSIS — M779 Enthesopathy, unspecified: Principal | ICD-10-CM

## 2016-09-07 MED ORDER — DICLOFENAC SODIUM 1 % TD GEL: 2.0000 g | Freq: Four times a day (QID) | TRANSDERMAL | 5 refills | Status: DC

## 2016-09-07 MED ORDER — PREDNISONE 10 MG (21) PO TBPK: 10.0000 mg | ORAL_TABLET | Freq: Every day | ORAL | 0 refills | Status: DC

## 2016-09-07 NOTE — Progress Notes (Signed)
Office Visit Note   Patient: Richard Novak           Date of Birth: 10-20-1951           MRN: [id number] Visit Date: 09/07/2016              Requested by: Neena Rhymes, MD [address], [place] [address] PCP: No PCP Per Patient   Assessment & Plan: Visit Diagnoses:  1. Tendinitis of left triceps   2. Olecranon bursitis of left elbow     Plan:  - RICE - doesn't appear to be infected - prednisone taper, voltaren gel - avoid resting elbow on surfaces - f/u prn  Follow-Up Instructions: Return if symptoms worsen or fail to improve.   Orders:  No orders of the defined types were placed in this encounter.  Meds ordered this encounter  Medications  . predniSONE (STERAPRED UNI-PAK 21 TAB) 10 MG (21) TBPK tablet    Sig: Take 1 tablet (10 mg total) by mouth daily.    Dispense:  21 tablet    Refill:  0  . diclofenac sodium (VOLTAREN) 1 % GEL    Sig: Apply 2 g topically 4 (four) times daily.    Dispense:  1 Tube    Refill:  5      Procedures: No procedures performed   Clinical Data: No additional findings.   Subjective: Chief Complaint  Patient presents with  . Left Elbow - Pain, Injury    65 yo male with 12 day h/o left elbow pain and swelling.  Denies f/c/n/v.  Was doing a lot with his left arm doing yardwork.  He endroses worsening pain with use of the arm and better with rest.  Has 2/10 constant sharp pains.  Doesn't radiate.  Take tylenol and aleve with partial relief    Review of Systems  Constitutional: Negative.   HENT: Negative.   Eyes: Negative.   Respiratory: Negative.   Cardiovascular: Negative.   Gastrointestinal: Negative.   Endocrine: Negative.   Genitourinary: Negative.   Musculoskeletal: Negative.   Skin: Negative.   Allergic/Immunologic: Negative.   Neurological: Negative.   Hematological: Negative.   Psychiatric/Behavioral: Negative.      Objective: Vital Signs: There were no vitals taken for this visit.  Physical  Exam  Constitutional: He is oriented to person, place, and time. He appears well-developed and well-nourished.  HENT:  Head: Normocephalic and atraumatic.  Eyes: EOM are normal.  Neck: Neck supple.  Cardiovascular: Intact distal pulses.   Pulmonary/Chest: Effort normal.  Abdominal: Soft.  Musculoskeletal:       Left knee: He exhibits no effusion.  Neurological: He is alert and oriented to person, place, and time.  Skin: Skin is warm.  Psychiatric: He has a normal mood and affect. His behavior is normal. Judgment and thought content normal.  Nursing note and vitals reviewed.   Left Knee Exam   Other  Effusion: no effusion present   Left Elbow Exam   Tenderness  The patient is experiencing tenderness in the olecranon fossa.   Range of Motion  The patient has normal left elbow ROM.  Muscle Strength  The patient has normal left elbow strength.  Other  Sensation: normal Pulse: present  Comments:  Mild swelling.  No cellulitis.  ttp olecranon bursa and triceps insertion.  Pain with resisted elbow extension.      Specialty Comments:  No specialty comments available.  Imaging: No results found.   PMFS History: Patient  Active Problem List   Diagnosis Date Noted  . Tendinitis of left triceps 09/07/2016  . Olecranon bursitis of left elbow 09/07/2016  . Medicare annual wellness visit, initial 06/09/2015  . Personal history of colonic adenomas 03/18/2013  . Routine health maintenance 06/29/2011  . DEPRESSION, ACUTE 08/18/2009  . HEADACHE 08/18/2009  . NONGONOCOCCAL URETHRITIS DUE OTHER SPEC ORGANISM 07/16/2009  . ROTATOR CUFF INJURY, LEFT SHOULDER 07/16/2009  . TENDINITIS, LEFT ELBOW 07/16/2009  . URTICARIA, HX OF 07/16/2009   Past Medical History:  Diagnosis Date  . Nongonococcal urethritis (NGU) due to other specified organism   . Personal history of colonic adenomas 03/18/2013  . Rotator cuff injury    Left  . Tendinitis of left elbow   . Urticaria       Family History  Problem Relation Age of Onset  . Hypertension Mother   . Dementia Mother   . Prostate cancer Father   . Coronary artery disease Father   . Cancer Father     lung cancer  . Heart disease Father     CAD/CABG  . Diabetes Neg Hx   . Heart attack Neg Hx   . Colon cancer Neg Hx   . Hyperlipidemia Neg Hx   . Healthy Maternal Grandmother   . Testicular cancer Maternal Grandfather   . Healthy Paternal Grandmother   . Hiatal hernia Paternal Grandfather     Past Surgical History:  Procedure Laterality Date  . APPENDECTOMY  1968  . ROTATOR CUFF REPAIR  2008   Arthroscopic Repair Lorin Mercy)   Social History   Occupational History  . 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  [employer]  . Driver for [employer]    Social History Main Topics  . Smoking status: Former Smoker    Packs/day: 1.00    Years: 22.00    Types: Cigarettes    Quit date: 06/26/2002  . Smokeless tobacco: Never Used  . Alcohol use 6.0 oz/week    5 Standard drinks or equivalent, 2 Cans of beer, 3 Glasses of wine per week  . Drug use: No  . Sexual activity: Yes    Partners: Female

## 2016-09-18 ENCOUNTER — Telehealth (INDEPENDENT_AMBULATORY_CARE_PROVIDER_SITE_OTHER): Payer: Self-pay | Admitting: *Deleted

## 2016-09-18 NOTE — Telephone Encounter (Signed)
Pt. Called stating he was taking the steroids prescribed and since he is finished taking them he is swelling again. He is wanting a refill of these steroids. Pt requesting call back

## 2016-09-19 NOTE — Telephone Encounter (Signed)
yes

## 2016-09-19 NOTE — Telephone Encounter (Signed)
Please advise 

## 2016-09-20 MED ORDER — PREDNISONE 10 MG (21) PO TBPK: 10.0000 mg | ORAL_TABLET | Freq: Every day | ORAL | 0 refills | Status: DC

## 2016-09-20 NOTE — Telephone Encounter (Signed)
Called pt. Rx was faxed into pharm.

## 2016-10-02 ENCOUNTER — Ambulatory Visit (INDEPENDENT_AMBULATORY_CARE_PROVIDER_SITE_OTHER): Payer: Medicare Other | Admitting: Orthopaedic Surgery

## 2016-10-02 ENCOUNTER — Encounter (INDEPENDENT_AMBULATORY_CARE_PROVIDER_SITE_OTHER): Payer: Self-pay | Admitting: Orthopaedic Surgery

## 2016-10-02 DIAGNOSIS — M7022 Olecranon bursitis, left elbow: Secondary | ICD-10-CM | POA: Diagnosis not present

## 2016-10-02 NOTE — Progress Notes (Signed)
Office Visit Note   Patient: Richard Novak           Date of Birth: October 11, 1951           MRN: [id number] Visit Date: 10/02/2016              Requested by: No referring provider defined for this encounter. PCP: No PCP Per Patient   Assessment & Plan: Visit Diagnoses:  1. Olecranon bursitis of left elbow     Plan: Patient is not septic and I would like to taken to operating room on Wednesday for formal washout and culture. Out of work for 1 week. No signs of septic arthritis.  Follow-Up Instructions: Return if symptoms worsen or fail to improve.   Orders:  No orders of the defined types were placed in this encounter.  No orders of the defined types were placed in this encounter.     Procedures: No procedures performed   Clinical Data: No additional findings.   Subjective: Chief Complaint  Patient presents with  . Left Elbow - Pain, Drainage from Incision    HPI Patient comes in today for follow-up of left elbow pain and new onset of redness swelling and yellowish drainage since 09/25/2016. He denies any constitutional symptoms. He's been taking Aleve. Pain is 4 out of 10. Review of Systems Complete review of systems is negative except for history of present illness  Objective: Vital Signs: There were no vitals taken for this visit.  Physical Exam Well-developed well-nourished no acute distress alert and 3 Ortho Exam Exam of the left elbow shows fluctuant redness around the olecranon bursa.  Range of motion of the elbow is normal and without pain. Specialty Comments:  No specialty comments available.  Imaging: No results found.   PMFS History: Patient Active Problem List   Diagnosis Date Noted  . Tendinitis of left triceps 09/07/2016  . Olecranon bursitis of left elbow 09/07/2016  . Medicare annual wellness visit, initial 06/09/2015  . Personal history of colonic adenomas 03/18/2013  . Routine health maintenance 06/29/2011  . DEPRESSION, ACUTE  08/18/2009  . HEADACHE 08/18/2009  . NONGONOCOCCAL URETHRITIS DUE OTHER SPEC ORGANISM 07/16/2009  . ROTATOR CUFF INJURY, LEFT SHOULDER 07/16/2009  . TENDINITIS, LEFT ELBOW 07/16/2009  . URTICARIA, HX OF 07/16/2009   Past Medical History:  Diagnosis Date  . Nongonococcal urethritis (NGU) due to other specified organism   . Personal history of colonic adenomas 03/18/2013  . Rotator cuff injury    Left  . Tendinitis of left elbow   . Urticaria     Family History  Problem Relation Age of Onset  . Hypertension Mother   . Dementia Mother   . Prostate cancer Father   . Coronary artery disease Father   . Cancer Father     lung cancer  . Heart disease Father     CAD/CABG  . Diabetes Neg Hx   . Heart attack Neg Hx   . Colon cancer Neg Hx   . Hyperlipidemia Neg Hx   . Healthy Maternal Grandmother   . Testicular cancer Maternal Grandfather   . Healthy Paternal Grandmother   . Hiatal hernia Paternal Grandfather     Past Surgical History:  Procedure Laterality Date  . APPENDECTOMY  1968  . ROTATOR CUFF REPAIR  2008   Arthroscopic Repair Lorin Mercy)   Social History   Occupational History  . 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  [employer]  . Driver for [employer]    Social History Main Topics  . Smoking status: Former Smoker    Packs/day: 1.00    Years: 22.00    Types: Cigarettes    Quit date: 06/26/2002  . Smokeless tobacco: Never Used  . Alcohol use 6.0 oz/week    5 Standard drinks or equivalent, 2 Cans of beer, 3 Glasses of wine per week  . Drug use: No  . Sexual activity: Yes    Partners: Female

## 2016-10-03 ENCOUNTER — Other Ambulatory Visit (INDEPENDENT_AMBULATORY_CARE_PROVIDER_SITE_OTHER): Payer: Self-pay | Admitting: Orthopaedic Surgery

## 2016-10-03 ENCOUNTER — Encounter (HOSPITAL_BASED_OUTPATIENT_CLINIC_OR_DEPARTMENT_OTHER): Payer: Self-pay | Admitting: *Deleted

## 2016-10-04 ENCOUNTER — Ambulatory Visit (HOSPITAL_BASED_OUTPATIENT_CLINIC_OR_DEPARTMENT_OTHER)
Admission: RE | Admit: 2016-10-04 | Discharge: 2016-10-04 | Disposition: A | Discharge status: Home / Self Care | Payer: Medicare Other | Source: Ambulatory Visit | Attending: Orthopaedic Surgery | Admitting: Orthopaedic Surgery

## 2016-10-04 ENCOUNTER — Ambulatory Visit (HOSPITAL_BASED_OUTPATIENT_CLINIC_OR_DEPARTMENT_OTHER): Payer: Medicare Other | Admitting: Anesthesiology

## 2016-10-04 ENCOUNTER — Encounter (HOSPITAL_BASED_OUTPATIENT_CLINIC_OR_DEPARTMENT_OTHER): Payer: Self-pay | Admitting: *Deleted

## 2016-10-04 ENCOUNTER — Encounter (HOSPITAL_BASED_OUTPATIENT_CLINIC_OR_DEPARTMENT_OTHER)
Admission: RE | Disposition: A | Discharge status: Home / Self Care | Payer: Self-pay | Source: Ambulatory Visit | Attending: Orthopaedic Surgery

## 2016-10-04 DIAGNOSIS — M778 Other enthesopathies, not elsewhere classified: Secondary | ICD-10-CM | POA: Diagnosis not present

## 2016-10-04 DIAGNOSIS — M7022 Olecranon bursitis, left elbow: Secondary | ICD-10-CM | POA: Diagnosis not present

## 2016-10-04 DIAGNOSIS — Z87891 Personal history of nicotine dependence: Secondary | ICD-10-CM | POA: Diagnosis not present

## 2016-10-04 DIAGNOSIS — R51 Headache: Secondary | ICD-10-CM | POA: Diagnosis not present

## 2016-10-04 DIAGNOSIS — F329 Major depressive disorder, single episode, unspecified: Secondary | ICD-10-CM | POA: Diagnosis not present

## 2016-10-04 HISTORY — PX: OLECRANON BURSECTOMY: SHX2097

## 2016-10-04 HISTORY — DX: Other bursitis of elbow, unspecified elbow: M70.30

## 2016-10-04 SURGERY — BURSECTOMY, ELBOW
Anesthesia: General | Site: Elbow | Laterality: Left

## 2016-10-04 MED ORDER — PROMETHAZINE HCL 25 MG PO TABS: 25.0000 mg | ORAL_TABLET | Freq: Four times a day (QID) | ORAL | 1 refills | Status: DC | PRN

## 2016-10-04 MED ORDER — PROPOFOL 10 MG/ML IV BOLUS
INTRAVENOUS | Status: AC
  Filled 2016-10-04: qty 20

## 2016-10-04 MED ORDER — BUPIVACAINE HCL (PF) 0.25 % IJ SOLN
INTRAMUSCULAR | Status: DC | PRN
  Administered 2016-10-04: 8 mL

## 2016-10-04 MED ORDER — DEXAMETHASONE SODIUM PHOSPHATE 4 MG/ML IJ SOLN
INTRAMUSCULAR | Status: DC | PRN
  Administered 2016-10-04: 10 mg via INTRAVENOUS

## 2016-10-04 MED ORDER — HYDROCODONE-ACETAMINOPHEN 7.5-325 MG PO TABS: 1.0000 | ORAL_TABLET | Freq: Once | ORAL | Status: DC | PRN

## 2016-10-04 MED ORDER — VANCOMYCIN HCL 500 MG IV SOLR
INTRAVENOUS | Status: AC
  Filled 2016-10-04: qty 1000

## 2016-10-04 MED ORDER — HYDROCODONE-ACETAMINOPHEN 5-325 MG PO TABS
ORAL_TABLET | ORAL | Status: AC
  Filled 2016-10-04: qty 1

## 2016-10-04 MED ORDER — SULFAMETHOXAZOLE-TRIMETHOPRIM 800-160 MG PO TABS: 1.0000 | ORAL_TABLET | Freq: Two times a day (BID) | ORAL | 0 refills | Status: DC

## 2016-10-04 MED ORDER — FENTANYL CITRATE (PF) 100 MCG/2ML IJ SOLN: 50.0000 ug | INTRAMUSCULAR | Status: DC | PRN

## 2016-10-04 MED ORDER — FENTANYL CITRATE (PF) 100 MCG/2ML IJ SOLN
INTRAMUSCULAR | Status: DC | PRN
  Administered 2016-10-04: 100 ug via INTRAVENOUS

## 2016-10-04 MED ORDER — MIDAZOLAM HCL 2 MG/2ML IJ SOLN: 1.0000 mg | INTRAMUSCULAR | Status: DC | PRN

## 2016-10-04 MED ORDER — LACTATED RINGERS IV SOLN
INTRAVENOUS | Status: DC
  Administered 2016-10-04: 09:00:00 via INTRAVENOUS
  Administered 2016-10-04: 10 mL/h via INTRAVENOUS

## 2016-10-04 MED ORDER — MIDAZOLAM HCL 2 MG/2ML IJ SOLN
INTRAMUSCULAR | Status: AC
  Filled 2016-10-04: qty 2

## 2016-10-04 MED ORDER — LIDOCAINE HCL (CARDIAC) 20 MG/ML IV SOLN
INTRAVENOUS | Status: DC | PRN
  Administered 2016-10-04: 30 mg via INTRAVENOUS

## 2016-10-04 MED ORDER — FENTANYL CITRATE (PF) 100 MCG/2ML IJ SOLN
INTRAMUSCULAR | Status: AC
  Filled 2016-10-04: qty 2

## 2016-10-04 MED ORDER — SENNOSIDES-DOCUSATE SODIUM 8.6-50 MG PO TABS: 1.0000 | ORAL_TABLET | Freq: Every evening | ORAL | 1 refills | Status: DC | PRN

## 2016-10-04 MED ORDER — MEPERIDINE HCL 25 MG/ML IJ SOLN: 6.2500 mg | INTRAMUSCULAR | Status: DC | PRN

## 2016-10-04 MED ORDER — ONDANSETRON HCL 4 MG PO TABS: 4.0000 mg | ORAL_TABLET | Freq: Three times a day (TID) | ORAL | 0 refills | Status: DC | PRN

## 2016-10-04 MED ORDER — SCOPOLAMINE 1 MG/3DAYS TD PT72: 1.0000 | MEDICATED_PATCH | Freq: Once | TRANSDERMAL | Status: DC | PRN

## 2016-10-04 MED ORDER — BUPIVACAINE HCL (PF) 0.25 % IJ SOLN
INTRAMUSCULAR | Status: AC
  Filled 2016-10-04: qty 30

## 2016-10-04 MED ORDER — HYDROCODONE-ACETAMINOPHEN 5-325 MG PO TABS: 1.0000 | ORAL_TABLET | Freq: Four times a day (QID) | ORAL | 0 refills | Status: DC | PRN

## 2016-10-04 MED ORDER — ONDANSETRON HCL 4 MG/2ML IJ SOLN: 4.0000 mg | Freq: Once | INTRAMUSCULAR | Status: DC | PRN

## 2016-10-04 MED ORDER — SODIUM CHLORIDE 0.9 % IV SOLN
INTRAVENOUS | Status: DC | PRN
  Administered 2016-10-04: 1000 mg via INTRAVENOUS

## 2016-10-04 MED ORDER — PROPOFOL 10 MG/ML IV BOLUS
INTRAVENOUS | Status: DC | PRN
  Administered 2016-10-04: 200 mg via INTRAVENOUS

## 2016-10-04 MED ORDER — LIDOCAINE 2% (20 MG/ML) 5 ML SYRINGE
INTRAMUSCULAR | Status: AC
  Filled 2016-10-04: qty 5

## 2016-10-04 MED ORDER — HYDROCODONE-ACETAMINOPHEN 5-325 MG PO TABS
1.0000 | ORAL_TABLET | Freq: Once | ORAL | Status: AC
  Administered 2016-10-04: 1 via ORAL

## 2016-10-04 MED ORDER — ONDANSETRON HCL 4 MG/2ML IJ SOLN
INTRAMUSCULAR | Status: DC | PRN
  Administered 2016-10-04: 4 mg via INTRAVENOUS

## 2016-10-04 MED ORDER — MIDAZOLAM HCL 5 MG/5ML IJ SOLN
INTRAMUSCULAR | Status: DC | PRN
  Administered 2016-10-04: 2 mg via INTRAVENOUS

## 2016-10-04 MED ORDER — DEXAMETHASONE SODIUM PHOSPHATE 10 MG/ML IJ SOLN
INTRAMUSCULAR | Status: AC
  Filled 2016-10-04: qty 1

## 2016-10-04 MED ORDER — FENTANYL CITRATE (PF) 100 MCG/2ML IJ SOLN: 25.0000 ug | INTRAMUSCULAR | Status: DC | PRN

## 2016-10-04 MED ORDER — ONDANSETRON HCL 4 MG/2ML IJ SOLN
INTRAMUSCULAR | Status: AC
  Filled 2016-10-04: qty 2

## 2016-10-04 SURGICAL SUPPLY — 61 items
BANDAGE ACE 4X5 VEL STRL LF (GAUZE/BANDAGES/DRESSINGS) ×3 IMPLANT
BLADE SURG 15 STRL LF DISP TIS (BLADE) ×1 IMPLANT
BLADE SURG 15 STRL SS (BLADE) ×3
BNDG CMPR 9X4 STRL LF SNTH (GAUZE/BANDAGES/DRESSINGS) ×1
BNDG COHESIVE 3X5 TAN STRL LF (GAUZE/BANDAGES/DRESSINGS) ×3 IMPLANT
BNDG ESMARK 4X9 LF (GAUZE/BANDAGES/DRESSINGS) ×3 IMPLANT
BNDG GAUZE ELAST 4 BULKY (GAUZE/BANDAGES/DRESSINGS) ×2 IMPLANT
CLOSURE WOUND 1/4X4 (GAUZE/BANDAGES/DRESSINGS)
COVER BACK TABLE 60X90IN (DRAPES) ×3 IMPLANT
COVER MAYO STAND STRL (DRAPES) ×3 IMPLANT
CUFF TOURN SGL LL 18 NRW (TOURNIQUET CUFF) ×3 IMPLANT
DECANTER SPIKE VIAL GLASS SM (MISCELLANEOUS) ×2 IMPLANT
DRAPE EXTREMITY T 121X128X90 (DRAPE) ×3 IMPLANT
DRAPE IMP U-DRAPE 54X76 (DRAPES) ×3 IMPLANT
DRAPE SURG 17X23 STRL (DRAPES) ×3 IMPLANT
DURAPREP 26ML APPLICATOR (WOUND CARE) ×3 IMPLANT
ELECT REM PT RETURN 9FT ADLT (ELECTROSURGICAL) ×3
ELECTRODE REM PT RTRN 9FT ADLT (ELECTROSURGICAL) ×1 IMPLANT
GAUZE SPONGE 4X4 12PLY STRL (GAUZE/BANDAGES/DRESSINGS) ×3 IMPLANT
GAUZE XEROFORM 1X8 LF (GAUZE/BANDAGES/DRESSINGS) ×3 IMPLANT
GLOVE BIOGEL PI IND STRL 7.0 (GLOVE) IMPLANT
GLOVE BIOGEL PI INDICATOR 7.0 (GLOVE) ×4
GLOVE ECLIPSE 6.5 STRL STRAW (GLOVE) ×2 IMPLANT
GLOVE SKINSENSE NS SZ7.5 (GLOVE) ×2
GLOVE SKINSENSE STRL SZ7.5 (GLOVE) ×1 IMPLANT
GLOVE SURG SYN 7.5  E (GLOVE) ×2
GLOVE SURG SYN 7.5 E (GLOVE) ×1 IMPLANT
GLOVE SURG SYN 7.5 PF PI (GLOVE) ×1 IMPLANT
GOWN STRL REIN XL XLG (GOWN DISPOSABLE) ×3 IMPLANT
GOWN STRL REUS W/ TWL LRG LVL3 (GOWN DISPOSABLE) ×1 IMPLANT
GOWN STRL REUS W/TWL LRG LVL3 (GOWN DISPOSABLE) ×3
IV NS IRRIG 3000ML ARTHROMATIC (IV SOLUTION) ×2 IMPLANT
MANIFOLD NEPTUNE II (INSTRUMENTS) ×2 IMPLANT
NEEDLE HYPO 22GX1.5 SAFETY (NEEDLE) ×2 IMPLANT
NS IRRIG 1000ML POUR BTL (IV SOLUTION) ×3 IMPLANT
PACK BASIN DAY SURGERY FS (CUSTOM PROCEDURE TRAY) ×3 IMPLANT
PAD CAST 4YDX4 CTTN HI CHSV (CAST SUPPLIES) ×1 IMPLANT
PADDING CAST ABS 4INX4YD NS (CAST SUPPLIES)
PADDING CAST ABS COTTON 4X4 ST (CAST SUPPLIES) IMPLANT
PADDING CAST COTTON 4X4 STRL (CAST SUPPLIES) ×3
PENCIL BUTTON HOLSTER BLD 10FT (ELECTRODE) ×3 IMPLANT
SET IRRIG Y TYPE TUR BLADDER L (SET/KITS/TRAYS/PACK) ×2 IMPLANT
SLEEVE SCD COMPRESS KNEE MED (MISCELLANEOUS) ×3 IMPLANT
SPLINT PLASTER CAST XFAST 3X15 (CAST SUPPLIES) IMPLANT
SPLINT PLASTER XTRA FASTSET 3X (CAST SUPPLIES)
STOCKINETTE IMPERVIOUS LG (DRAPES) ×3 IMPLANT
STRIP CLOSURE SKIN 1/4X4 (GAUZE/BANDAGES/DRESSINGS) IMPLANT
SUCTION YANKAUER HANDLE (MISCELLANEOUS) ×2 IMPLANT
SUT ETHILON 3 0 PS 1 (SUTURE) ×2 IMPLANT
SUT ETHILON 4 0 PS 2 18 (SUTURE) ×1 IMPLANT
SUT VIC AB 2-0 CT1 27 (SUTURE) ×3
SUT VIC AB 2-0 CT1 TAPERPNT 27 (SUTURE) ×1 IMPLANT
SUT VIC AB 4-0 P-3 18XBRD (SUTURE) IMPLANT
SUT VIC AB 4-0 P3 18 (SUTURE)
SWAB COLLECTION DEVICE MRSA (MISCELLANEOUS) ×2 IMPLANT
SYR BULB 3OZ (MISCELLANEOUS) ×3 IMPLANT
TOWEL OR 17X24 6PK STRL BLUE (TOWEL DISPOSABLE) ×5 IMPLANT
TUBE ANAEROBIC SPECIMEN COL (MISCELLANEOUS) ×2 IMPLANT
TUBE CONNECTING 20'X1/4 (TUBING) ×1
TUBE CONNECTING 20X1/4 (TUBING) ×1 IMPLANT
UNDERPAD 30X30 (UNDERPADS AND DIAPERS) ×1 IMPLANT

## 2016-10-04 NOTE — Discharge Instructions (Signed)

## 2016-10-04 NOTE — Anesthesia Procedure Notes (Signed)
Procedure Name: LMA Insertion Date/Time: 10/04/2016 8:31 AM Performed by: Toula Moos L Pre-anesthesia Checklist: Patient identified, Emergency Drugs available, Suction available, Patient being monitored and Timeout performed Patient Re-evaluated:Patient Re-evaluated prior to inductionOxygen Delivery Method: Circle system utilized Preoxygenation: Pre-oxygenation with 100% oxygen Intubation Type: IV induction Ventilation: Mask ventilation without difficulty LMA: LMA inserted LMA Size: 5.0 Number of attempts: 1 Airway Equipment and Method: Bite block Placement Confirmation: positive ETCO2 Tube secured with: Tape Dental Injury: Teeth and Oropharynx as per pre-operative assessment

## 2016-10-04 NOTE — Op Note (Signed)
   Date of Surgery: 10/04/2016  INDICATIONS: Richard Novak is a 65 y.o.-year-old male with a left septic olecranon bursitis;  The patient did consent to the procedure after discussion of the risks and benefits.  PREOPERATIVE DIAGNOSIS: Left septic olecranon bursitis  POSTOPERATIVE DIAGNOSIS: Same.  PROCEDURE: Irrigation and debridement and left olecranon bursectomy with partial closure and packing with wet-to-dry dressings  SURGEON: N. Eduard Roux, M.D.  ASSIST: None.  ANESTHESIA:  general  IV FLUIDS AND URINE: See anesthesia.  ESTIMATED BLOOD LOSS: Minimal mL.  IMPLANTS: None  DRAINS: None  COMPLICATIONS: None.  DESCRIPTION OF PROCEDURE: The patient was brought to the operating room and placed supine on the operating table.  The patient had been signed prior to the procedure and this was documented. The patient had the anesthesia placed by the anesthesiologist.  A time-out was performed to confirm that this was the correct patient, site, side and location. The patient did receive antibiotics prior to the incision and was re-dosed during the procedure as needed at indicated intervals.  A tourniquet was placed.  The patient had the operative extremity prepped and draped in the standard surgical fashion.    An incision was made directly over the olecranon bursa. There was return of purulence. This was then cultured. Antibiotic's were then given after the cultures were obtained. I then performed sharp excisional debridement of the skin and subcutaneous tissue and bursa with a Roger and knife. After thorough excisional debridement I then irrigated the wound with normal saline. Hemostasis was obtained. I partially closed the distal extent of the surgical incision. The rest of the wound was left open and packed with wet to dries. Sterile dressings were placed. Patient tolerated the procedure well had no immediate complications.  POSTOPERATIVE PLAN: Patient will need wet-to-dry dressings twice a day  until the wound is closed. We will place him on 2 weeks of oral Bactrim.  Azucena Cecil, MD [place] 9:01 AM

## 2016-10-04 NOTE — Transfer of Care (Signed)
Immediate Anesthesia Transfer of Care Note  Patient: Richard Novak  Procedure(s) Performed: Procedure(s): LEFT OLECRANON BURSECTOMY (Left)  Patient Location: PACU  Anesthesia Type:General  Level of Consciousness: sedated  Airway & Oxygen Therapy: Patient Spontanous Breathing and Patient connected to face mask oxygen  Post-op Assessment: Report given to RN and Post -op Vital signs reviewed and stable  Post vital signs: Reviewed and stable  Last Vitals:  Vitals:   10/04/16 0714  BP: 130/73  Pulse: 84  Resp: 20  Temp: 36.8 C    Last Pain:  Vitals:   10/04/16 0714  TempSrc: Oral         Complications: No apparent anesthesia complications

## 2016-10-04 NOTE — Anesthesia Preprocedure Evaluation (Signed)
Anesthesia Evaluation  Patient identified by MRN, date of birth, ID band Patient awake    Reviewed: Allergy & Precautions, NPO status , Patient's Chart, lab work & pertinent test results  Airway Mallampati: II  TM Distance: >3 FB Neck ROM: Full    Dental no notable dental hx. (+) Teeth Intact   Pulmonary former smoker,    Pulmonary exam normal breath sounds clear to auscultation       Cardiovascular negative cardio ROS Normal cardiovascular exam Rhythm:Regular Rate:Normal     Neuro/Psych  Headaches, PSYCHIATRIC DISORDERS Depression    GI/Hepatic negative GI ROS, Neg liver ROS,   Endo/Other  negative endocrine ROS  Renal/GU negative Renal ROS  negative genitourinary   Musculoskeletal  (+) Arthritis , Left septic olecranon bursitis   Abdominal   Peds  Hematology negative hematology ROS (+)   Anesthesia Other Findings   Reproductive/Obstetrics                             Anesthesia Physical Anesthesia Plan  ASA: I  Anesthesia Plan: General   Post-op Pain Management:    Induction: Intravenous  Airway Management Planned: LMA  Additional Equipment:   Intra-op Plan:   Post-operative Plan: Extubation in OR  Informed Consent: I have reviewed the patients History and Physical, chart, labs and discussed the procedure including the risks, benefits and alternatives for the proposed anesthesia with the patient or authorized representative who has indicated his/her understanding and acceptance.   Dental advisory given  Plan Discussed with: Anesthesiologist, CRNA and Surgeon  Anesthesia Plan Comments:         Anesthesia Quick Evaluation

## 2016-10-04 NOTE — H&P (Signed)
PREOPERATIVE H&P  Chief Complaint: left septic olecranon bursitis  HPI: Richard Novak is a 65 y.o. male who presents for surgical treatment of left septic olecranon bursitis.  He denies any changes in medical history.  Past Medical History:  Diagnosis Date  . Bursitis of elbow    left  . Nongonococcal urethritis (NGU) due to other specified organism   . Personal history of colonic adenomas 03/18/2013  . Rotator cuff injury    Left  . Tendinitis of left elbow   . Urticaria    Past Surgical History:  Procedure Laterality Date  . APPENDECTOMY  1968  . ROTATOR CUFF REPAIR  2008   [facility])   Social History   Social History  . Marital status: Married    Spouse name: N/A  . Number of children: 2  . Years of education: 14   Occupational History  . Repair/maintenance tech - digital imaging equip [employer]     Retired in March 2011  .  [employer]  . Driver for [employer]    Social History Main Topics  . Smoking status: Former Smoker    Packs/day: 1.00    Years: 22.00    Types: Cigarettes    Quit date: 06/26/2002  . Smokeless tobacco: Never Used  . Alcohol use 6.0 oz/week    3 Glasses of wine, 2 Cans of beer, 5 Standard drinks or equivalent per week     Comment: social  . Drug use: No  . Sexual activity: Yes    Partners: Female   Other Topics Concern  . None   Social History Narrative   HSG, 2 yrs [employer]. Married '71. 2 daughters - '74, '77; 2 grandchildren. Marriage in [place]. Retired -but working part-time for [employer]. POA wife. Full resuscitation, short-term mechanical ventilation.      Fun: Work in the yard, fishing at [employer]   Denies religious beliefs effecting health care.             Family History  Problem Relation Age of Onset  . Hypertension Mother   . Dementia Mother   . Prostate cancer Father   . Coronary artery disease Father   . Cancer Father     lung cancer  . Heart disease Father     CAD/CABG  .  Healthy Maternal Grandmother   . Testicular cancer Maternal Grandfather   . Healthy Paternal Grandmother   . Hiatal hernia Paternal Grandfather   . Diabetes Neg Hx   . Heart attack Neg Hx   . Colon cancer Neg Hx   . Hyperlipidemia Neg Hx    Allergies  Allergen Reactions  . Sulfa Antibiotics Nausea Only    Makes me gag!   Prior to Admission medications   Medication Sig Start Date End Date Taking? Authorizing Provider  Flax Oil-Fish Oil-Borage Oil (FISH OIL-FLAX OIL-BORAGE OIL) CAPS Take by mouth. Take 2 capsules bid   Yes Historical Provider, MD  MULTIPLE VITAMIN PO Take 1 tablet by mouth daily.     Yes Historical Provider, MD  NON FORMULARY Herbal blend for prostate-Take one capsule bid   Yes Historical Provider, MD  vitamin C (ASCORBIC ACID) 500 MG tablet Take 500 mg by mouth 2 (two) times daily.   Yes Historical Provider, MD  Vitamin D, Cholecalciferol, 1000 units CAPS Take by mouth.   Yes Historical Provider, MD  vitamin E 100 UNIT capsule Take by mouth daily.   Yes Historical Provider, MD  zinc  gluconate 50 MG tablet Take 50 mg by mouth daily.   Yes Historical Provider, MD     Positive ROS: All other systems have been reviewed and were otherwise negative with the exception of those mentioned in the HPI and as above.  Physical Exam: General: Alert, no acute distress Cardiovascular: No pedal edema Respiratory: No cyanosis, no use of accessory musculature GI: abdomen soft Skin: No lesions in the area of chief complaint Neurologic: Sensation intact distally Psychiatric: Patient is competent for consent with normal mood and affect Lymphatic: no lymphedema  MUSCULOSKELETAL: exam stable  Assessment: left septic olecranon bursitis  Plan: Plan for Procedure(s): LEFT OLECRANON BURSECTOMY  The risks benefits and alternatives were discussed with the patient including but not limited to the risks of nonoperative treatment, versus surgical intervention including infection,  bleeding, nerve injury,  blood clots, cardiopulmonary complications, morbidity, mortality, among others, and they were willing to proceed.   Eduard Roux, MD   10/04/2016 8:16 AM

## 2016-10-04 NOTE — Anesthesia Postprocedure Evaluation (Signed)
Anesthesia Post Note  Patient: Richard Novak  Procedure(s) Performed: Procedure(s) (LRB): LEFT OLECRANON BURSECTOMY (Left)  Patient location during evaluation: PACU Anesthesia Type: General Level of consciousness: awake and alert and oriented Pain management: pain level controlled Vital Signs Assessment: post-procedure vital signs reviewed and stable Respiratory status: spontaneous breathing, nonlabored ventilation and respiratory function stable Cardiovascular status: blood pressure returned to baseline and stable Postop Assessment: no signs of nausea or vomiting Anesthetic complications: no    Last Vitals:  Vitals:   10/04/16 0930 10/04/16 0945  BP: 113/67 (!) 158/90  Pulse: 77 79  Resp: (!) 8 13  Temp:      Last Pain:  Vitals:   10/04/16 0945  TempSrc:   PainSc: 4                  [name]

## 2016-10-05 ENCOUNTER — Encounter (HOSPITAL_BASED_OUTPATIENT_CLINIC_OR_DEPARTMENT_OTHER): Payer: Self-pay | Admitting: Orthopaedic Surgery

## 2016-10-06 ENCOUNTER — Telehealth (INDEPENDENT_AMBULATORY_CARE_PROVIDER_SITE_OTHER): Payer: Self-pay | Admitting: Orthopaedic Surgery

## 2016-10-06 DIAGNOSIS — Z4801 Encounter for change or removal of surgical wound dressing: Secondary | ICD-10-CM | POA: Diagnosis not present

## 2016-10-06 DIAGNOSIS — E785 Hyperlipidemia, unspecified: Secondary | ICD-10-CM | POA: Diagnosis not present

## 2016-10-06 DIAGNOSIS — F329 Major depressive disorder, single episode, unspecified: Secondary | ICD-10-CM | POA: Diagnosis not present

## 2016-10-06 DIAGNOSIS — M71122 Other infective bursitis, left elbow: Secondary | ICD-10-CM | POA: Diagnosis not present

## 2016-10-06 DIAGNOSIS — Z4789 Encounter for other orthopedic aftercare: Secondary | ICD-10-CM | POA: Diagnosis not present

## 2016-10-06 DIAGNOSIS — M65222 Calcific tendinitis, left upper arm: Secondary | ICD-10-CM | POA: Diagnosis not present

## 2016-10-06 NOTE — Telephone Encounter (Signed)
Please advise see message below

## 2016-10-06 NOTE — Telephone Encounter (Signed)
Pam from [facility] called to request orders for frequency  1w1 , 2 w 4, and 2 prn  Says everything looks good.  Cb#: [phone]

## 2016-10-09 LAB — AEROBIC/ANAEROBIC CULTURE W GRAM STAIN (SURGICAL/DEEP WOUND)

## 2016-10-09 LAB — AEROBIC/ANAEROBIC CULTURE (SURGICAL/DEEP WOUND)

## 2016-10-10 DIAGNOSIS — M71122 Other infective bursitis, left elbow: Secondary | ICD-10-CM | POA: Diagnosis not present

## 2016-10-10 DIAGNOSIS — M65222 Calcific tendinitis, left upper arm: Secondary | ICD-10-CM | POA: Diagnosis not present

## 2016-10-10 DIAGNOSIS — Z4789 Encounter for other orthopedic aftercare: Secondary | ICD-10-CM | POA: Diagnosis not present

## 2016-10-10 DIAGNOSIS — Z4801 Encounter for change or removal of surgical wound dressing: Secondary | ICD-10-CM | POA: Diagnosis not present

## 2016-10-10 DIAGNOSIS — E785 Hyperlipidemia, unspecified: Secondary | ICD-10-CM | POA: Diagnosis not present

## 2016-10-10 DIAGNOSIS — F329 Major depressive disorder, single episode, unspecified: Secondary | ICD-10-CM | POA: Diagnosis not present

## 2016-10-12 ENCOUNTER — Encounter (INDEPENDENT_AMBULATORY_CARE_PROVIDER_SITE_OTHER): Payer: Self-pay | Admitting: Orthopaedic Surgery

## 2016-10-12 ENCOUNTER — Encounter (INDEPENDENT_AMBULATORY_CARE_PROVIDER_SITE_OTHER): Payer: Self-pay

## 2016-10-12 ENCOUNTER — Telehealth (INDEPENDENT_AMBULATORY_CARE_PROVIDER_SITE_OTHER): Payer: Self-pay | Admitting: *Deleted

## 2016-10-12 ENCOUNTER — Ambulatory Visit (INDEPENDENT_AMBULATORY_CARE_PROVIDER_SITE_OTHER): Payer: Medicare Other | Admitting: Orthopaedic Surgery

## 2016-10-12 DIAGNOSIS — M7022 Olecranon bursitis, left elbow: Secondary | ICD-10-CM

## 2016-10-12 MED ORDER — MUPIROCIN 2 % EX OINT: 1.0000 "application " | TOPICAL_OINTMENT | Freq: Two times a day (BID) | CUTANEOUS | 0 refills | Status: DC

## 2016-10-12 MED ORDER — MUPIROCIN 2 % EX OINT: TOPICAL_OINTMENT | Freq: Two times a day (BID) | CUTANEOUS | Status: DC

## 2016-10-12 NOTE — Telephone Encounter (Signed)
Called pt to let him know note is ready for pick up at the front desk. No answer LMOM.

## 2016-10-12 NOTE — Progress Notes (Signed)
Patient is one week status post I&D of septic left olecranon bursa. The cultures showed MSSA. He is doing well. Physical exam shows that the wound is healing up nicely. He has a small hole in the skin. No signs of continued infection or drainage. Plan is to continue with oral Bactrim until finished. He may begin Bactroban ointment twice a day to the small area in the elbow.  I'll see him back in 2 weeks for another wound check.

## 2016-10-12 NOTE — Telephone Encounter (Signed)
yes

## 2016-10-12 NOTE — Telephone Encounter (Signed)
Pt called stating he needs a note to go back to [facility], CB when finished for pt to come pick up [phone]

## 2016-10-12 NOTE — Telephone Encounter (Signed)
Please advise 

## 2016-10-17 ENCOUNTER — Inpatient Hospital Stay (INDEPENDENT_AMBULATORY_CARE_PROVIDER_SITE_OTHER): Payer: Medicare Other | Admitting: Orthopaedic Surgery

## 2016-10-19 ENCOUNTER — Encounter: Payer: Self-pay | Admitting: Internal Medicine

## 2016-10-19 ENCOUNTER — Other Ambulatory Visit (INDEPENDENT_AMBULATORY_CARE_PROVIDER_SITE_OTHER): Payer: Medicare Other

## 2016-10-19 ENCOUNTER — Ambulatory Visit (INDEPENDENT_AMBULATORY_CARE_PROVIDER_SITE_OTHER): Payer: Medicare Other | Admitting: Internal Medicine

## 2016-10-19 VITALS — BP 106/66 | HR 78 | Temp 98.1°F | Resp 16 | Ht 71.0 in | Wt 204.0 lb

## 2016-10-19 DIAGNOSIS — Z1159 Encounter for screening for other viral diseases: Secondary | ICD-10-CM

## 2016-10-19 DIAGNOSIS — E7800 Pure hypercholesterolemia, unspecified: Secondary | ICD-10-CM

## 2016-10-19 DIAGNOSIS — Z136 Encounter for screening for cardiovascular disorders: Secondary | ICD-10-CM | POA: Diagnosis not present

## 2016-10-19 DIAGNOSIS — Z0000 Encounter for general adult medical examination without abnormal findings: Secondary | ICD-10-CM

## 2016-10-19 DIAGNOSIS — R739 Hyperglycemia, unspecified: Secondary | ICD-10-CM

## 2016-10-19 DIAGNOSIS — Z23 Encounter for immunization: Secondary | ICD-10-CM | POA: Diagnosis not present

## 2016-10-19 DIAGNOSIS — M7022 Olecranon bursitis, left elbow: Secondary | ICD-10-CM

## 2016-10-19 DIAGNOSIS — Z125 Encounter for screening for malignant neoplasm of prostate: Secondary | ICD-10-CM

## 2016-10-19 DIAGNOSIS — E785 Hyperlipidemia, unspecified: Secondary | ICD-10-CM | POA: Insufficient documentation

## 2016-10-19 LAB — COMPREHENSIVE METABOLIC PANEL
ALBUMIN: 4.2 g/dL (ref 3.5–5.2)
ALT: 87 U/L — AB (ref 0–53)
AST: 54 U/L — AB (ref 0–37)
Alkaline Phosphatase: 82 U/L (ref 39–117)
BILIRUBIN TOTAL: 0.5 mg/dL (ref 0.2–1.2)
BUN: 23 mg/dL (ref 6–23)
CALCIUM: 9.5 mg/dL (ref 8.4–10.5)
CO2: 31 mEq/L (ref 19–32)
CREATININE: 1.26 mg/dL (ref 0.40–1.50)
Chloride: 104 mEq/L (ref 96–112)
GFR: 60.65 mL/min (ref >60.00)
Glucose, Bld: 101 mg/dL — ABNORMAL HIGH (ref 70–99)
Potassium: 5 mEq/L (ref 3.5–5.1)
Sodium: 140 mEq/L (ref 135–145)
Total Protein: 6.4 g/dL (ref 6.0–8.3)

## 2016-10-19 LAB — PSA, MEDICARE: PSA: 0.87 ng/ml (ref 0.10–4.00)

## 2016-10-19 LAB — LIPID PANEL
CHOLESTEROL: 180 mg/dL (ref 0–200)
HDL: 41.3 mg/dL (ref >39.00)
LDL Cholesterol: 108 mg/dL — ABNORMAL HIGH (ref 0–99)
NonHDL: 138.61
Total CHOL/HDL Ratio: 4
Triglycerides: 154 mg/dL — ABNORMAL HIGH (ref 0.0–149.0)
VLDL: 30.8 mg/dL (ref 0.0–40.0)

## 2016-10-19 LAB — HEPATITIS C ANTIBODY: HCV AB: NEGATIVE

## 2016-10-19 LAB — TSH: TSH: 1.04 u[IU]/mL (ref 0.35–4.50)

## 2016-10-19 LAB — HEMOGLOBIN A1C: HEMOGLOBIN A1C: 6 % (ref 4.6–6.5)

## 2016-10-19 NOTE — Patient Instructions (Signed)
  Mr. Fuselier , Thank you for taking time to come for your Medicare Wellness Visit. I appreciate your ongoing commitment to your health goals. Please review the following plan we discussed and let me know if I can assist you in the future.   These are the goals we discussed: Goals    None      This is a list of the screening recommended for you and due dates:  Health Maintenance  Topic Date Due  .  Hepatitis C: One time screening is recommended by Center for Disease Control  (CDC) for  adults born from [birth year] through 1967.   today  . Tetanus Vaccine  today  . Shingles Vaccine  09/10/2009  . Pneumonia vaccines (2 of 2 - PPSV23) today  . Flu Shot  02/04/2017*  . Colon Cancer Screening  03/18/2018  *Topic was postponed. The date shown is not the original due date.     Test(s) ordered today. Your results will be released to [facility] (or called to you) after review, usually within 72hours after test completion. If any changes need to be made, you will be notified at that same time.  All other Health Maintenance issues reviewed.   All recommended immunizations and age-appropriate screenings are up-to-date or discussed.  Pneumovax and tetanus immunizations administered today.   Medications reviewed and updated.  No changes recommended at this time.   An abdominal ultrasound was ordered.    Please followup in 12-18 months for a wellness visit

## 2016-10-19 NOTE — Progress Notes (Signed)
Pre visit review using our clinic review tool, if applicable. No additional management support is needed unless otherwise documented below in the visit note. 

## 2016-10-19 NOTE — Addendum Note (Signed)
Addended by: Terence Lux B on: 10/19/2016 10:35 AM   Modules accepted: Orders

## 2016-10-19 NOTE — Assessment & Plan Note (Signed)
Diet is healthy - low in sugars and carbs Active and exercises Check a1c Work on weight loss

## 2016-10-19 NOTE — Assessment & Plan Note (Signed)
Improved with diet over the last few years Check lipids, tsh, cmp Low fat/chol diet and regular exercise discussed Work on weight loss

## 2016-10-19 NOTE — Assessment & Plan Note (Signed)
Recent infection s/p surgery Just finished abx Check cbc

## 2016-10-19 NOTE — Progress Notes (Signed)
Subjective:    Patient ID: Richard Novak, male    DOB: July 01, 1951, 65 y.o.   MRN: [id number]  HPI Here for medicare wellness exam and follow up of chronic medical problems.   He has a family  history of prostate cancer and AAA and is concerned about both.   Hyperglycemia:  His sugar last year was just above normal.  He limits his sweets and carbs.  He eats gluten free most of the time.  He is very active and typically does some walking, but has not exercised since his recent left arm surgery.   Hyperlipidemia:  He has had elevated cholesterol in the past.  He has changed his diet and his cholesterol was better last year.  He does exercise.    I have personally reviewed and have noted 1.The patient's medical and social history 2.Their use of alcohol, tobacco or illicit drugs 3.Their current medications and supplements 4.The patient's functional ability including ADL's, fall risks, home safety risks and hearing or visual impairment. 5.Diet and physical activities 6.Evidence for depression or mood disorders 7.Care team reviewed  - Ortho - Dr Erlinda Hong   Are there smokers in your home (other than you)? No  Risk Factors Exercise: usually walks,  None recently due to recent surgery Dietary issues discussed:   Avoids sweets, limits carbs, eats fruit, veges  Cardiac risk factors: advanced age, hypertension, hyperlipidemia, and obesity (BMI >= 30 kg/m2).  Depression Screen  Have you felt down, depressed or hopeless? No  Have you felt little interest or pleasure in doing things?  No  Activities of Daily Living In your present state of health, do you have any difficulty performing the following activities?:  Driving? No Managing money?  No Feeding yourself? No Getting from bed to chair? No Climbing a flight of stairs? No Preparing food and eating?: No Bathing or showering? No Getting dressed: No Getting to/using the toilet?  No Moving around from place to place: No In the past year have you fallen or had a near fall?: No   Are you sexually active?  Yes   Do you have more than one partner?  No   Hearing Difficulties: No Do you often ask people to speak up or repeat themselves? No Do you experience ringing or noises in your ears? Yes  Do you have difficulty understanding soft or whispered voices? No Vision:              Any change in vision:  no             Up to date with eye exam:  yes Memory:  Do you feel that you have a problem with memory? No  Do you often misplace items? No  Do you feel safe at home?  Yes  Cognitive Testing  Alert, Orientated? Yes  Normal Appearance? Yes  Recall of three objects?  Yes  Can perform simple calculations? Yes  Displays appropriate judgment? Yes  Can read the correct time from a watch face? Yes   Advanced Directives have been discussed with the patient? Yes - in place   Medications and allergies reviewed with patient and updated if appropriate.  Patient Active Problem List   Diagnosis Date Noted  . Hyperglycemia 10/19/2016  . Hyperlipidemia 10/19/2016  . Tendinitis of left triceps 09/07/2016  . Olecranon bursitis of left elbow 09/07/2016  . Personal history of colonic adenomas 03/18/2013  . DEPRESSION, ACUTE 08/18/2009  . HEADACHE 08/18/2009  . ROTATOR CUFF INJURY, LEFT  SHOULDER 07/16/2009  . TENDINITIS, LEFT ELBOW 07/16/2009  . URTICARIA, HX OF 07/16/2009    Current Outpatient Prescriptions on File Prior to Visit  Medication Sig Dispense Refill  . Flax Oil-Fish Oil-Borage Oil (FISH OIL-FLAX OIL-BORAGE OIL) CAPS Take by mouth. Take 2 capsules bid    . mupirocin ointment (BACTROBAN) 2 % Place 1 application into the nose 2 (two) times daily. 22 g 0  . NON FORMULARY Herbal blend for prostate-Take one capsule bid    . senna-docusate (SENOKOT S) 8.6-50 MG tablet Take 1 tablet by mouth at bedtime as needed. 30 tablet 1  . vitamin C (ASCORBIC ACID) 500 MG  tablet Take 500 mg by mouth 2 (two) times daily.    . Vitamin D, Cholecalciferol, 1000 units CAPS Take by mouth.    . vitamin E 100 UNIT capsule Take by mouth daily.    Marland Kitchen zinc gluconate 50 MG tablet Take 50 mg by mouth daily.     No current facility-administered medications on file prior to visit.     Past Medical History:  Diagnosis Date  . Bursitis of elbow    left  . Nongonococcal urethritis (NGU) due to other specified organism   . Personal history of colonic adenomas 03/18/2013  . Rotator cuff injury    Left  . Tendinitis of left elbow   . Urticaria     Past Surgical History:  Procedure Laterality Date  . APPENDECTOMY  1968  . OLECRANON BURSECTOMY Left 10/04/2016   Procedure: LEFT OLECRANON BURSECTOMY;  Surgeon: Leandrew Koyanagi, MD;  Location: [facility];  Service: Orthopedics;  Laterality: Left;  . ROTATOR CUFF REPAIR  2008   [facility])    Social History   Social History  . Marital status: Married    Spouse name: N/A  . Number of children: 2  . Years of education: 14   Occupational History  . 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  [employer]  . Driver for [employer]    Social History Main Topics  . Smoking status: Former Smoker    Packs/day: 1.00    Years: 22.00    Types: Cigarettes    Quit date: 06/26/2002  . Smokeless tobacco: Never Used  . Alcohol use 6.0 oz/week    3 Glasses of wine, 2 Cans of beer, 5 Standard drinks or equivalent per week     Comment: social  . Drug use: No  . Sexual activity: Yes    Partners: Female   Other Topics Concern  . Not on file   Social History Narrative   HSG, 2 yrs [employer]. Married '71. 2 daughters - '74, '77; 2 grandchildren. Marriage in [place]. Retired -but working part-time for [employer]. POA wife. Full resuscitation, short-term mechanical ventilation.      Fun: Work in the yard, fishing at [employer]   Denies religious  beliefs effecting health care.              Family History  Problem Relation Age of Onset  . Hypertension Mother   . Dementia Mother   . Prostate cancer Father   . Coronary artery disease Father   . Cancer Father     lung cancer  . Heart disease Father     CAD/CABG  . Healthy Maternal Grandmother   . Testicular cancer Maternal Grandfather   . Healthy Paternal Grandmother   . Hiatal hernia Paternal Grandfather   .  Diabetes Neg Hx   . Heart attack Neg Hx   . Colon cancer Neg Hx   . Hyperlipidemia Neg Hx     Review of Systems  Constitutional: Negative for appetite change, chills, fatigue, fever and unexpected weight change.  HENT: Positive for tinnitus. Negative for hearing loss.   Eyes: Negative for visual disturbance.  Respiratory: Negative for cough, shortness of breath and wheezing.   Cardiovascular: Negative for chest pain, palpitations and leg swelling.  Gastrointestinal: Negative for abdominal pain, blood in stool, constipation, diarrhea and nausea.       Occ gerd  Genitourinary: Negative for dysuria and hematuria.       Decreased ejaculate, slightly weaker stream  Musculoskeletal: Negative for arthralgias and back pain.  Skin: Negative for color change and rash.  Neurological: Negative for dizziness, light-headedness and headaches.  Psychiatric/Behavioral: Negative for dysphoric mood. The patient is not nervous/anxious.        Objective:   Vitals:   10/19/16 0902  BP: 106/66  Pulse: 78  Resp: 16  Temp: 98.1 F (36.7 C)   Filed Weights   10/19/16 0902  Weight: 204 lb (92.5 kg)   Body mass index is 28.45 kg/m.   Physical Exam Constitutional: He appears well-developed and well-nourished. No distress.  HENT:  Head: Normocephalic and atraumatic.  Right Ear: External ear normal.  Left Ear: External ear normal.  Mouth/Throat: Oropharynx is clear and moist.  Normal ear canals and TM b/l  Eyes: Conjunctivae and EOM are normal.  Neck: Neck supple. No  tracheal deviation present. No thyromegaly present.  No carotid bruit  Cardiovascular: Normal rate, regular rhythm, normal heart sounds and intact distal pulses.   No murmur heard. Pulmonary/Chest: Effort normal and breath sounds normal. No respiratory distress. He has no wheezes. He has no rales.  Abdominal: Soft. Bowel sounds are normal. He exhibits no distension. There is no tenderness.  Genitourinary:   rectal tone normal, prostate slightly larger, no nodules Musculoskeletal: He exhibits no edema.  Lymphadenopathy:   He has no cervical adenopathy.  Skin: Skin is warm and dry. He is not diaphoretic.  Psychiatric: He has a normal mood and affect. His behavior is normal.        Assessment & Plan:   Wellness Exam: Immunizations deferred flu vaccine, pneumovax and tdap today Colonoscopy   Up to date  Eye exam  Up to date  - will schedule soon Hearing loss - tinnitus but no hearing concerns Memory concerns/difficulties - none Independent of ADLs    - fully Stressed the importance of regular exercise   Patient received copy of preventative screening tests/immunizations recommended for the next 5-10 years.   See Problem List for Assessment and Plan of chronic medical problems.

## 2016-10-20 ENCOUNTER — Telehealth: Payer: Self-pay | Admitting: Nurse Practitioner

## 2016-10-20 LAB — CBC WITH DIFFERENTIAL/PLATELET
BASOS ABS: 0 10*3/uL (ref 0.0–0.1)
BASOS PCT: 0.4 % (ref 0.0–3.0)
EOS ABS: 0.3 10*3/uL (ref 0.0–0.7)
Eosinophils Relative: 4.2 % (ref 0.0–5.0)
HEMATOCRIT: 41.3 % (ref 39.0–52.0)
HEMOGLOBIN: 14.1 g/dL (ref 13.0–17.0)
LYMPHS PCT: 31.3 % (ref 12.0–46.0)
Lymphs Abs: 1.9 10*3/uL (ref 0.7–4.0)
MCHC: 34.1 g/dL (ref 30.0–36.0)
MCV: 91.4 fl (ref 78.0–100.0)
MONOS PCT: 5.7 % (ref 3.0–12.0)
Monocytes Absolute: 0.3 10*3/uL (ref 0.1–1.0)
NEUTROS ABS: 3.5 10*3/uL (ref 1.4–7.7)
Neutrophils Relative %: 58.4 % (ref 43.0–77.0)
Platelets: 221 10*3/uL (ref 150.0–400.0)
RBC: 4.52 Mil/uL (ref 4.22–5.81)
RDW: 13.6 % (ref 11.5–15.5)
WBC: 6 10*3/uL (ref 4.0–10.5)

## 2016-10-20 NOTE — Telephone Encounter (Signed)
Noted  

## 2016-10-20 NOTE — Telephone Encounter (Signed)
Patient called back gave results of Hep C

## 2016-10-22 ENCOUNTER — Encounter: Payer: Self-pay | Admitting: Internal Medicine

## 2016-10-22 DIAGNOSIS — R7303 Prediabetes: Secondary | ICD-10-CM | POA: Insufficient documentation

## 2016-10-22 DIAGNOSIS — R7989 Other specified abnormal findings of blood chemistry: Secondary | ICD-10-CM | POA: Insufficient documentation

## 2016-10-22 DIAGNOSIS — R945 Abnormal results of liver function studies: Secondary | ICD-10-CM

## 2016-10-26 ENCOUNTER — Encounter (INDEPENDENT_AMBULATORY_CARE_PROVIDER_SITE_OTHER): Payer: Self-pay | Admitting: Orthopaedic Surgery

## 2016-10-26 ENCOUNTER — Ambulatory Visit (INDEPENDENT_AMBULATORY_CARE_PROVIDER_SITE_OTHER): Payer: Medicare Other | Admitting: Orthopaedic Surgery

## 2016-10-26 DIAGNOSIS — M7022 Olecranon bursitis, left elbow: Secondary | ICD-10-CM

## 2016-10-26 NOTE — Progress Notes (Signed)
Patient is 4 weeks status post I&D left septic olecranon bursitis is doing well. He just says he has some swelling and tightness with terminal elbow flexion. It is slightly tender. Physical examination shows a slightly red area without any fluctuance or signs of infection. The surgical incisions well-healed. Excellent range of motion. At this point continue with compression ice and rest and occasional NSAIDs patient is doing well see him back as needed.

## 2016-12-06 ENCOUNTER — Other Ambulatory Visit (HOSPITAL_COMMUNITY): Payer: Medicare Other

## 2016-12-08 ENCOUNTER — Ambulatory Visit (HOSPITAL_COMMUNITY)
Admission: RE | Admit: 2016-12-08 | Discharge: 2016-12-08 | Disposition: A | Discharge status: Home / Self Care | Payer: Medicare Other | Source: Ambulatory Visit | Attending: Internal Medicine | Admitting: Internal Medicine

## 2016-12-08 DIAGNOSIS — Z136 Encounter for screening for cardiovascular disorders: Secondary | ICD-10-CM

## 2016-12-08 DIAGNOSIS — Z87891 Personal history of nicotine dependence: Secondary | ICD-10-CM | POA: Diagnosis not present

## 2016-12-08 DIAGNOSIS — Z8249 Family history of ischemic heart disease and other diseases of the circulatory system: Secondary | ICD-10-CM | POA: Diagnosis not present

## 2018-02-28 ENCOUNTER — Encounter: Payer: Self-pay | Admitting: Internal Medicine

## 2018-04-03 ENCOUNTER — Ambulatory Visit (INDEPENDENT_AMBULATORY_CARE_PROVIDER_SITE_OTHER): Payer: PPO | Admitting: Family

## 2018-04-03 ENCOUNTER — Encounter: Payer: Self-pay | Admitting: Family

## 2018-04-03 VITALS — BP 110/68 | HR 88 | Temp 98.7°F | Ht 71.0 in | Wt 202.0 lb

## 2018-04-03 DIAGNOSIS — J209 Acute bronchitis, unspecified: Secondary | ICD-10-CM | POA: Diagnosis not present

## 2018-04-03 MED ORDER — AZITHROMYCIN 250 MG PO TABS: ORAL_TABLET | ORAL | 0 refills | Status: DC

## 2018-04-03 MED ORDER — FLUTICASONE PROPIONATE 50 MCG/ACT NA SUSP: 2.0000 | Freq: Every day | NASAL | 6 refills | Status: DC

## 2018-04-03 NOTE — Progress Notes (Signed)
Richard Novak is a 67 y.o. male with the following history as recorded in [employer]:  Patient Active Problem List   Diagnosis Date Noted  . Elevated LFTs 10/22/2016  . Prediabetes 10/22/2016  . Hyperlipidemia 10/19/2016  . Tendinitis of left triceps 09/07/2016  . Olecranon bursitis of left elbow 09/07/2016  . Personal history of colonic adenomas 03/18/2013  . ROTATOR CUFF INJURY, LEFT SHOULDER 07/16/2009  . TENDINITIS, LEFT ELBOW 07/16/2009  . URTICARIA, HX OF 07/16/2009    Current Outpatient Medications  Medication Sig Dispense Refill  . Flax Oil-Fish Oil-Borage Oil (FISH OIL-FLAX OIL-BORAGE OIL) CAPS Take by mouth. Take 2 capsules bid    . NON FORMULARY Herbal blend for prostate-Take one capsule bid    . senna-docusate (SENOKOT S) 8.6-50 MG tablet Take 1 tablet by mouth at bedtime as needed. 30 tablet 1  . vitamin C (ASCORBIC ACID) 500 MG tablet Take 500 mg by mouth 2 (two) times daily.    . Vitamin D, Cholecalciferol, 1000 units CAPS Take by mouth.    . vitamin E 100 UNIT capsule Take by mouth daily.    Marland Kitchen zinc gluconate 50 MG tablet Take 50 mg by mouth daily.    Marland Kitchen azithromycin (ZITHROMAX) 250 MG tablet 2 tabs po qd x 1 day; 1 tablet per day x 4 days; 6 tablet 0  . fluticasone (FLONASE) 50 MCG/ACT nasal spray Place 2 sprays into both nostrils daily. 16 g 6   No current facility-administered medications for this visit.     Allergies: Sulfa antibiotics  Past Medical History:  Diagnosis Date  . Bursitis of elbow    left  . Nongonococcal urethritis (NGU) due to other specified organism   . Personal history of colonic adenomas 03/18/2013  . Rotator cuff injury    Left  . Tendinitis of left elbow   . Urticaria     Past Surgical History:  Procedure Laterality Date  . APPENDECTOMY  1968  . OLECRANON BURSECTOMY Left 10/04/2016   Procedure: LEFT OLECRANON BURSECTOMY;  Surgeon: Leandrew Koyanagi, MD;  Location: [facility];  Service: Orthopedics;  Laterality: Left;  .  ROTATOR CUFF REPAIR  2008   Arthroscopic Repair ([facility])    Family History  Problem Relation Age of Onset  . Hypertension Mother   . Dementia Mother   . Prostate cancer Father   . Coronary artery disease Father   . Cancer Father        lung cancer  . Heart disease Father        CAD/CABG  . Healthy Maternal Grandmother   . Testicular cancer Maternal Grandfather   . Healthy Paternal Grandmother   . Hiatal hernia Paternal Grandfather   . Diabetes Neg Hx   . Heart attack Neg Hx   . Colon cancer Neg Hx   . Hyperlipidemia Neg Hx     Social History   Tobacco Use  . Smoking status: Former Smoker    Packs/day: 1.00    Years: 22.00    Pack years: 22.00    Types: Cigarettes    Last attempt to quit: 06/26/2002    Years since quitting: 15.7  . Smokeless tobacco: Never Used  Substance Use Topics  . Alcohol use: Yes    Alcohol/week: 6.0 oz    Types: 3 Glasses of wine, 2 Cans of beer, 5 Standard drinks or equivalent per week    Comment: social    Subjective:  Presents with concerns for possible sinus infection; started on  Saturday with sinus drainage and progressing into chest; known to have underlying allergies; has started with OTC Claritin with little benefit; feels that congestion is moving into his chest;    Objective:  Vitals:   04/03/18 1412  BP: 110/68  Pulse: 88  Temp: 98.7 F (37.1 C)  TempSrc: Oral  SpO2: 96%  Weight: 202 lb 0.6 oz (91.6 kg)  Height: 5\' 11"  (1.803 m)    General: Well developed, well nourished, in no acute distress  Skin : Warm and dry.  Head: Normocephalic and atraumatic  Lungs: Respirations unlabored; clear to auscultation bilaterally without wheeze, rales, rhonchi  CVS exam: normal rate and regular rhythm.  Neurologic: Alert and oriented; speech intact; face symmetrical; moves all extremities well; CNII-XII intact without focal deficit   Assessment:  1. Acute bronchitis, unspecified organism     Plan:  Suspect underlying allergic  component; Rx for Z-pak and Flonase; increase fluids, rest and follow-up worse, no better.  No follow-ups on file.  No orders of the defined types were placed in this encounter.   Requested Prescriptions   Signed Prescriptions Disp Refills  . azithromycin (ZITHROMAX) 250 MG tablet 6 tablet 0    Sig: 2 tabs po qd x 1 day; 1 tablet per day x 4 days;  . fluticasone (FLONASE) 50 MCG/ACT nasal spray 16 g 6    Sig: Place 2 sprays into both nostrils daily.

## 2018-04-11 ENCOUNTER — Encounter: Payer: Self-pay | Admitting: Internal Medicine

## 2018-06-14 ENCOUNTER — Ambulatory Visit: Payer: PPO

## 2018-06-14 ENCOUNTER — Ambulatory Visit (AMBULATORY_SURGERY_CENTER): Payer: Self-pay | Admitting: *Deleted

## 2018-06-14 ENCOUNTER — Encounter: Payer: PPO | Admitting: Internal Medicine

## 2018-06-14 VITALS — Ht 70.0 in | Wt 206.8 lb

## 2018-06-14 DIAGNOSIS — Z86010 Personal history of colonic polyps: Secondary | ICD-10-CM

## 2018-06-14 NOTE — Progress Notes (Signed)
Denies allergies to eggs or soy products. Denies complications with sedation or anesthesia. Denies O2 use. Denies use of diet or weight loss medications.  Emmi instructions given for colonoscopy.  

## 2018-06-17 ENCOUNTER — Encounter: Payer: Self-pay | Admitting: Internal Medicine

## 2018-06-28 ENCOUNTER — Encounter: Payer: Self-pay | Admitting: Internal Medicine

## 2018-06-28 ENCOUNTER — Ambulatory Visit (AMBULATORY_SURGERY_CENTER): Payer: PPO | Admitting: Internal Medicine

## 2018-06-28 VITALS — BP 142/71 | HR 80 | Temp 99.1°F | Resp 19 | Ht 71.0 in | Wt 202.0 lb

## 2018-06-28 DIAGNOSIS — Z1211 Encounter for screening for malignant neoplasm of colon: Secondary | ICD-10-CM | POA: Diagnosis not present

## 2018-06-28 DIAGNOSIS — Z860100 Personal history of colon polyps, unspecified: Secondary | ICD-10-CM

## 2018-06-28 MED ORDER — SODIUM CHLORIDE 0.9 % IV SOLN: 500.0000 mL | Freq: Once | INTRAVENOUS | Status: DC

## 2018-06-28 NOTE — Progress Notes (Signed)
A/ox3, pleased with MAC, report to RN 

## 2018-06-28 NOTE — Patient Instructions (Addendum)
No polyps today! Your next routine colonoscopy should be in 5 years - 2024.  If you want to have the hemorrhoids banded we can set up an appointment - looks like the one you had got cancelled.  If you have not tried fiber supplements to promote regular and easy bowel movements you should.  .Long Term Prevention of Recurrent Hemorrhoids:   1. Fiber - Western diets are typically deficient in dietary fiber, and the addition of 15 - 20 gm. of fiber will help you have stools of a proper consistency, limiting your need to strain.  In addition to the use of raw oat or wheat bran, there are a number of commercial preparations that are available (Metamucil, Benefiber and Citrucel are just a few).    2. Fluids - It is important to have a sufficient amount of water intake during the day, in part to help the fiber "do its job".  Unless you have a medical condition that would prohibit it, a minimum of 6 - 8 glasses per day is important to help keep a regular bowel movement.  3. Do not strain - Many experts feel that chronic straining is one of the causes for the development of hemorrhoids.  Trying to limit yourself to two minutes on the commode may well limit your risk of recurrent hemorrhoids.  Also, do not try to "hold it" or avoid going to the bathroom when the urge is there.  These behavioral changes are thought to be very helpful in maintaining good bowel health.  I appreciate the opportunity to care for you. Gatha Mayer, MD, [facility]   Diverticulosis handout given to patient. Hemorrhoid handout given to patient.  Repeat colonoscopy in 5 years for surveillance.  YOU HAD AN ENDOSCOPIC PROCEDURE TODAY AT [facility]:   Refer to the procedure report that was given to you for any specific questions about what was found during the examination.  If the procedure report does not answer your questions, please call your gastroenterologist to clarify.  If you requested that your  care partner not be given the details of your procedure findings, then the procedure report has been included in a sealed envelope for you to review at your convenience later.  YOU SHOULD EXPECT: Some feelings of bloating in the abdomen. Passage of more gas than usual.  Walking can help get rid of the air that was put into your GI tract during the procedure and reduce the bloating. If you had a lower endoscopy (such as a colonoscopy or flexible sigmoidoscopy) you may notice spotting of blood in your stool or on the toilet paper. If you underwent a bowel prep for your procedure, you may not have a normal bowel movement for a few days.  Please Note:  You might notice some irritation and congestion in your nose or some drainage.  This is from the oxygen used during your procedure.  There is no need for concern and it should clear up in a day or so.  SYMPTOMS TO REPORT IMMEDIATELY:   Following lower endoscopy (colonoscopy or flexible sigmoidoscopy):  Excessive amounts of blood in the stool  Significant tenderness or worsening of abdominal pains  Swelling of the abdomen that is new, acute  Fever of 100F or higher For urgent or emergent issues, a gastroenterologist can be reached at any hour by calling [phone].   DIET:  We do recommend a small meal at first, but then you may proceed to your regular diet.  Drink plenty of fluids but you should avoid alcoholic beverages for 24 hours.  ACTIVITY:  You should plan to take it easy for the rest of today and you should NOT DRIVE or use heavy machinery until tomorrow (because of the sedation medicines used during the test).    FOLLOW UP: Our staff will call the number listed on your records the next business day following your procedure to check on you and address any questions or concerns that you may have regarding the information given to you following your procedure. If we do not reach you, we will leave a message.  However, if you are feeling  well and you are not experiencing any problems, there is no need to return our call.  We will assume that you have returned to your regular daily activities without incident.  If any biopsies were taken you will be contacted by phone or by letter within the next 1-3 weeks.  Please call us at [phone] if you have not heard about the biopsies in 3 weeks.    SIGNATURES/CONFIDENTIALITY: You and/or your care partner have signed paperwork which will be entered into your electronic medical record.  These signatures attest to the fact that that the information above on your After Visit Summary has been reviewed and is understood.  Full responsibility of the confidentiality of this discharge information lies with you and/or your care-partner.

## 2018-06-28 NOTE — Op Note (Signed)
[facility] Patient Name: Richard Novak Procedure Date: 06/28/2018 9:19 AM MRN: [id number] Endoscopist: Gatha Mayer , MD Age: 67 Referring MD:  Date of Birth: [birth year] Gender: Male Account #: [id number] Procedure:                Colonoscopy Indications:              Surveillance: Personal history of adenomatous                            polyps on last colonoscopy 5 years ago Medicines:                Propofol per Anesthesia, Monitored Anesthesia Care Procedure:                Pre-Anesthesia Assessment:                           - Prior to the procedure, a History and Physical                            was performed, and patient medications and                            allergies were reviewed. The patient's tolerance of                            previous anesthesia was also reviewed. The risks                            and benefits of the procedure and the sedation                            options and risks were discussed with the patient.                            All questions were answered, and informed consent                            was obtained. Prior Anticoagulants: The patient has                            taken no previous anticoagulant or antiplatelet                            agents. ASA Grade Assessment: II - A patient with                            mild systemic disease. After reviewing the risks                            and benefits, the patient was deemed in                            satisfactory condition to undergo the procedure.  After obtaining informed consent, the colonoscope                            was passed under direct vision. Throughout the                            procedure, the patient's blood pressure, pulse, and                            oxygen saturations were monitored continuously. The                            Colonoscope was introduced through the anus and   advanced to the the cecum, identified by                            appendiceal orifice and ileocecal valve. The                            colonoscopy was performed without difficulty. The                            patient tolerated the procedure well. The quality                            of the bowel preparation was excellent. The                            ileocecal valve, appendiceal orifice, and rectum                            were photographed. The bowel preparation used was                            Miralax. Scope In: 9:35:47 AM Scope Out: 9:50:25 AM Scope Withdrawal Time: 0 hours 10 minutes 22 seconds  Total Procedure Duration: 0 hours 14 minutes 38 seconds  Findings:                 The perianal and digital rectal examinations were                            normal. Pertinent negatives include normal prostate                            (size, shape, and consistency).                           A few diverticula were found in the sigmoid colon.                           External and internal hemorrhoids were found.                           The exam was otherwise without abnormality on  direct and retroflexion views. Complications:            No immediate complications. Estimated Blood Loss:     Estimated blood loss: none. Impression:               - Diverticulosis in the sigmoid colon.                           - External and internal hemorrhoids.                           - The examination was otherwise normal on direct                            and retroflexion views.                           - No specimens collected.                           - Personal history of colonic polyps. adenomas -                            last 2014 Recommendation:           - Patient has a contact number available for                            emergencies. The signs and symptoms of potential                            delayed complications were discussed with the                             patient. Return to normal activities tomorrow.                            Written discharge instructions were provided to the                            patient.                           - Resume previous diet.                           - Continue present medications.                           - Repeat colonoscopy in 5 years for surveillance.                           - he has expressed interest in banding of                            hemorrhoids - may schedule an appointment if  desired.                           have given lifestyle measures info also Gatha Mayer, MD 06/28/2018 9:58:38 AM This report has been signed electronically.

## 2018-06-28 NOTE — Progress Notes (Signed)
Pt's states no medical or surgical changes since previsit or office visit. 

## 2018-07-01 ENCOUNTER — Telehealth: Payer: Self-pay

## 2018-07-01 NOTE — Telephone Encounter (Signed)
  Follow up Call-  Call back number 06/28/2018  Post procedure Call Back phone  # [phone]  Permission to leave phone message Yes  Some recent data might be hidden     Patient questions:  Do you have a fever, pain , or abdominal swelling? No. Pain Score  0 *  Have you tolerated food without any problems? Yes.    Have you been able to return to your normal activities? Yes.    Do you have any questions about your discharge instructions: Diet   No. Medications  No. Follow up visit  No.  Do you have questions or concerns about your Care? No.  Actions: * If pain score is 4 or above: No action needed, pain <4.

## 2018-07-24 NOTE — Patient Instructions (Addendum)
Tests ordered today. Your results will be released to [facility] (or called to you) after review, usually within 72hours after test completion. If any changes need to be made, you will be notified at that same time.  All other Health Maintenance issues reviewed.   All recommended immunizations and age-appropriate screenings are up-to-date or discussed.  No immunizations administered today.   Consider getting the shingles vaccine.    Medications reviewed and updated.  Changes include :   none   Please followup in one year     Health Maintenance, Male A healthy lifestyle and preventive care is important for your health and wellness. Ask your health care provider about what schedule of regular examinations is right for you. What should I know about weight and diet? Eat a Healthy Diet  Eat plenty of vegetables, fruits, whole grains, low-fat dairy products, and lean protein.  Do not eat a lot of foods high in solid fats, added sugars, or salt.  Maintain a Healthy Weight Regular exercise can help you achieve or maintain a healthy weight. You should:  Do at least 150 minutes of exercise each week. The exercise should increase your heart rate and make you sweat (moderate-intensity exercise).  Do strength-training exercises at least twice a week.  Watch Your Levels of Cholesterol and Blood Lipids  Have your blood tested for lipids and cholesterol every 5 years starting at 69 years of age. If you are at high risk for heart disease, you should start having your blood tested when you are 69 years old. You may need to have your cholesterol levels checked more often if: ? Your lipid or cholesterol levels are high. ? You are older than 69 years of age. ? You are at high risk for heart disease.  What should I know about cancer screening? Many types of cancers can be detected early and may often be prevented. Lung Cancer  You should be screened every year for lung cancer if: ? You are a  current smoker who has smoked for at least 30 years. ? You are a former smoker who has quit within the past 15 years.  Talk to your health care provider about your screening options, when you should start screening, and how often you should be screened.  Colorectal Cancer  Routine colorectal cancer screening usually begins at 69 years of age and should be repeated every 5-10 years until you are 69 years old. You may need to be screened more often if early forms of precancerous polyps or small growths are found. Your health care provider may recommend screening at an earlier age if you have risk factors for colon cancer.  Your health care provider may recommend using home test kits to check for hidden blood in the stool.  A small camera at the end of a tube can be used to examine your colon (sigmoidoscopy or colonoscopy). This checks for the earliest forms of colorectal cancer.  Prostate and Testicular Cancer  Depending on your age and overall health, your health care provider may do certain tests to screen for prostate and testicular cancer.  Talk to your health care provider about any symptoms or concerns you have about testicular or prostate cancer.  Skin Cancer  Check your skin from head to toe regularly.  Tell your health care provider about any new moles or changes in moles, especially if: ? There is a change in a mole's size, shape, or color. ? You have a mole that is larger than a  pencil eraser.  Always use sunscreen. Apply sunscreen liberally and repeat throughout the day.  Protect yourself by wearing long sleeves, pants, a wide-brimmed hat, and sunglasses when outside.  What should I know about heart disease, diabetes, and high blood pressure?  If you are 9-72 years of age, have your blood pressure checked every 3-5 years. If you are 16 years of age or older, have your blood pressure checked every year. You should have your blood pressure measured twice-once when you are at  a hospital or clinic, and once when you are not at a hospital or clinic. Record the average of the two measurements. To check your blood pressure when you are not at a hospital or clinic, you can use: ? An automated blood pressure machine at a pharmacy. ? A home blood pressure monitor.  Talk to your health care provider about your target blood pressure.  If you are between 19-41 years old, ask your health care provider if you should take aspirin to prevent heart disease.  Have regular diabetes screenings by checking your fasting blood sugar level. ? If you are at a normal weight and have a low risk for diabetes, have this test once every three years after the age of 21. ? If you are overweight and have a high risk for diabetes, consider being tested at a younger age or more often.  A one-time screening for abdominal aortic aneurysm (AAA) by ultrasound is recommended for men aged 56-75 years who are current or former smokers. What should I know about preventing infection? Hepatitis B If you have a higher risk for hepatitis B, you should be screened for this virus. Talk with your health care provider to find out if you are at risk for hepatitis B infection. Hepatitis C Blood testing is recommended for:  Everyone born from [birth year] through 1967.  Anyone with known risk factors for hepatitis C.  Sexually Transmitted Diseases (STDs)  You should be screened each year for STDs including gonorrhea and chlamydia if: ? You are sexually active and are younger than 69 years of age. ? You are older than 69 years of age and your health care provider tells you that you are at risk for this type of infection. ? Your sexual activity has changed since you were last screened and you are at an increased risk for chlamydia or gonorrhea. Ask your health care provider if you are at risk.  Talk with your health care provider about whether you are at high risk of being infected with HIV. Your health care provider  may recommend a prescription medicine to help prevent HIV infection.  What else can I do?  Schedule regular health, dental, and eye exams.  Stay current with your vaccines (immunizations).  Do not use any tobacco products, such as cigarettes, chewing tobacco, and e-cigarettes. If you need help quitting, ask your health care provider.  Limit alcohol intake to no more than 2 drinks per day. One drink equals 12 ounces of beer, 5 ounces of wine, or 1 ounces of hard liquor.  Do not use street drugs.  Do not share needles.  Ask your health care provider for help if you need support or information about quitting drugs.  Tell your health care provider if you often feel depressed.  Tell your health care provider if you have ever been abused or do not feel safe at home. This information is not intended to replace advice given to you by your health care provider. Make sure  you discuss any questions you have with your health care provider. Document Released: 04/20/2008 Document Revised: 06/21/2016 Document Reviewed: 07/27/2015 Elsevier Interactive Patient Education  [employer].

## 2018-07-24 NOTE — Progress Notes (Signed)
Subjective:    Patient ID: Richard Novak, male    DOB: 1950/12/24, 67 y.o.   MRN: [id number]  HPI He is here for a physical exam.   He saw the dentist recently and had a panoramic xray and had possible calcifications in his carotid arteries.    6 days of exercise a week.  He is dong better with sugars - only eats fruit.  Overall he feels well he denies any major changes in his history.  Medications and allergies reviewed with patient and updated if appropriate.  Patient Active Problem List   Diagnosis Date Noted  . Elevated LFTs 10/22/2016  . Prediabetes 10/22/2016  . Hyperlipidemia 10/19/2016  . Tendinitis of left triceps 09/07/2016  . Olecranon bursitis of left elbow 09/07/2016  . Personal history of colonic adenomas 03/18/2013  . ROTATOR CUFF INJURY, LEFT SHOULDER 07/16/2009  . TENDINITIS, LEFT ELBOW 07/16/2009  . URTICARIA, HX OF 07/16/2009    Current Outpatient Medications on File Prior to Visit  Medication Sig Dispense Refill  . Flax Oil-Fish Oil-Borage Oil (FISH OIL-FLAX OIL-BORAGE OIL) CAPS Take by mouth. Take 2 capsules bid    . fluticasone (FLONASE) 50 MCG/ACT nasal spray Place 2 sprays into both nostrils daily. 16 g 6  . NON FORMULARY Herbal blend for prostate-Take one capsule bid    . SELENIUM PO Take by mouth.    . senna-docusate (SENOKOT S) 8.6-50 MG tablet Take 1 tablet by mouth at bedtime as needed. 30 tablet 1  . vitamin C (ASCORBIC ACID) 500 MG tablet Take 500 mg by mouth 2 (two) times daily.    . Vitamin D, Cholecalciferol, 1000 units CAPS Take by mouth.    . vitamin E 100 UNIT capsule Take by mouth daily.    Marland Kitchen zinc gluconate 50 MG tablet Take 50 mg by mouth daily.     No current facility-administered medications on file prior to visit.     Past Medical History:  Diagnosis Date  . Bursitis of elbow    left  . Nongonococcal urethritis (NGU) due to other specified organism   . Personal history of colonic adenomas 03/18/2013  . Rotator cuff injury      Left  . Tendinitis of left elbow   . Urticaria     Past Surgical History:  Procedure Laterality Date  . APPENDECTOMY  1968  . COLONOSCOPY    . OLECRANON BURSECTOMY Left 10/04/2016   Procedure: LEFT OLECRANON BURSECTOMY;  Surgeon: Leandrew Koyanagi, MD;  Location: [facility];  Service: Orthopedics;  Laterality: Left;  . ROTATOR CUFF REPAIR  2008   Arthroscopic Repair ([facility])    Social History   Socioeconomic History  . Marital status: Married    Spouse name: Not on file  . Number of children: 2  . Years of education: 6  . Highest education level: Not on file  Occupational History  . 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 Occupation: Geophysicist/field seismologist for [employer]  . Financial resource strain: Not hard at all  . Food insecurity:    Worry: Never true    Inability: Never true  . Transportation needs:    Medical: No    Non-medical: No  Tobacco Use  . Smoking status: Former Smoker    Packs/day: 1.00    Years: 22.00    Pack years: 22.00    Types: Cigarettes  Last attempt to quit: 06/26/2002    Years since quitting: 16.0  . Smokeless tobacco: Never Used  Substance and Sexual Activity  . Alcohol use: Yes    Alcohol/week: 10.0 standard drinks    Types: 3 Glasses of wine, 2 Cans of beer, 5 Standard drinks or equivalent per week    Comment: social - 3-4 days a week  . Drug use: No  . Sexual activity: Yes    Partners: Female  Lifestyle  . Physical activity:    Days per week: 6 days    Minutes per session: 40 min  . Stress: Not at all  Relationships  . Social connections:    Talks on phone: More than three times a week    Gets together: More than three times a week    Attends religious service: More than 4 times per year    Active member of club or organization: Yes    Attends meetings of clubs or organizations: More than 4 times per year    Relationship  status: Married  Other Topics Concern  . Not on file  Social History Narrative   HSG, 2 yrs [employer]. Married '71. 2 daughters - '74, '77; 2 grandchildren. Marriage in [place]. Retired -but working part-time for [employer]. POA wife. Full resuscitation, short-term mechanical ventilation.      Fun: Work in the yard, fishing at [employer]   Denies religious beliefs effecting health care.           Family History  Problem Relation Age of Onset  . Hypertension Mother   . Dementia Mother   . Prostate cancer Father   . Coronary artery disease Father   . Cancer Father        lung cancer  . Heart disease Father        CAD/CABG  . Healthy Maternal Grandmother   . Testicular cancer Maternal Grandfather   . Healthy Paternal Grandmother   . Hiatal hernia Paternal Grandfather   . Diabetes Neg Hx   . Heart attack Neg Hx   . Colon cancer Neg Hx   . Hyperlipidemia Neg Hx   . Rectal cancer Neg Hx   . Stomach cancer Neg Hx     Review of Systems  Constitutional: Negative for chills and fever.  Eyes: Negative for visual disturbance.  Respiratory: Negative for cough, shortness of breath and wheezing.   Cardiovascular: Negative for chest pain, palpitations and leg swelling.  Gastrointestinal: Negative for abdominal pain, blood in stool, constipation, diarrhea and nausea.       No gerd  Genitourinary: Negative for dysuria and hematuria.  Musculoskeletal: Negative for arthralgias and back pain.  Skin: Negative for color change and rash.  Neurological: Negative for dizziness, light-headedness, numbness and headaches.  Psychiatric/Behavioral: Negative for dysphoric mood. The patient is not nervous/anxious.        Objective:   Vitals:   07/25/18 0859  BP: 128/82  Pulse: 84  Resp: 17  Temp: 98.1 F (36.7 C)  SpO2: 99%   Filed Weights   07/25/18 0859  Weight: 212 lb (96.2 kg)   Body mass index is 29.57 kg/m.  Wt Readings from Last 3 Encounters:  07/25/18 212 lb (96.2  kg)  07/25/18 212 lb (96.2 kg)  06/28/18 202 lb (91.6 kg)     Physical Exam Constitutional: He appears well-developed and well-nourished. No distress.  HENT:  Head: Normocephalic and atraumatic.  Right Ear: External ear normal.  Left Ear: External ear normal.  Mouth/Throat: Oropharynx is clear and moist.  Normal ear canals and TM b/l  Eyes: Conjunctivae and EOM are normal.  Neck: Neck supple. No tracheal deviation present. No thyromegaly present.  No carotid bruit  Cardiovascular: Normal rate, regular rhythm, normal heart sounds and intact distal pulses.   No murmur heard. Pulmonary/Chest: Effort normal and breath sounds normal. No respiratory distress. He has no wheezes. He has no rales.  Abdominal: Soft. He exhibits no distension. There is no tenderness.  Genitourinary: deferred  Musculoskeletal: He exhibits no edema.  Lymphadenopathy:   He has no cervical adenopathy.  Skin: Skin is warm and dry. He is not diaphoretic.  Psychiatric: He has a normal mood and affect. His behavior is normal.         Assessment & Plan:   Physical exam: Screening blood work  ordered Immunizations  Discussed shingrix, flu vaccine declined Colonoscopy   Up to date  Eye exams  Up to date  EKG   Done 2012 Exercise  6 days a week Weight encouraged weight loss Skin   No concerns Substance abuse   none  See Problem List for Assessment and Plan of chronic medical problems.   Follow-up in 1 year

## 2018-07-24 NOTE — Progress Notes (Addendum)
Subjective:   Richard Novak is a 67 y.o. male who presents for Medicare Annual/Subsequent preventive examination.  Review of Systems:  No ROS.  Medicare Wellness Visit. Additional risk factors are reflected in the social history.  Cardiac Risk Factors include: advanced age (>93men, >43 women);male gender Sleep patterns: feels rested on waking, gets up 1 times nightly to void and sleeps 7-8 hours nightly.    Home Safety/Smoke Alarms: Feels safe in home. Smoke alarms in place.  Living environment; residence and Firearm Safety: 1-story house/ trailer, no firearms. Lives with wife, no needs for DME, good support system Seat Belt Safety/Bike Helmet: Wears seat belt.      Objective:    Vitals: BP 128/82   Pulse 84   Temp 98.1 F (36.7 C)   Resp 17   Ht 5\' 11"  (1.803 m)   Wt 212 lb (96.2 kg)   SpO2 99%   BMI 29.57 kg/m   Body mass index is 29.57 kg/m.  Advanced Directives 07/25/2018 10/04/2016 10/03/2016  Does Patient Have a Medical Advance Directive? Yes Yes Yes  Type of Paramedic of [place];Living will Living will Living will  Does patient want to make changes to medical advance directive? - No - Patient declined -    Tobacco Social History   Tobacco Use  Smoking Status Former Smoker  . Packs/day: 1.00  . Years: 22.00  . Pack years: 22.00  . Types: Cigarettes  . Last attempt to quit: 06/26/2002  . Years since quitting: 16.0  Smokeless Tobacco Never Used     Counseling given: Not Answered  Past Medical History:  Diagnosis Date  . Bursitis of elbow    left  . Nongonococcal urethritis (NGU) due to other specified organism   . Personal history of colonic adenomas 03/18/2013  . Rotator cuff injury    Left  . Tendinitis of left elbow   . Urticaria    Past Surgical History:  Procedure Laterality Date  . APPENDECTOMY  1968  . COLONOSCOPY    . OLECRANON BURSECTOMY Left 10/04/2016   Procedure: LEFT OLECRANON BURSECTOMY;  Surgeon: Leandrew Koyanagi, MD;  Location: [facility];  Service: Orthopedics;  Laterality: Left;  . ROTATOR CUFF REPAIR  2008   Arthroscopic Repair ([facility])   Family History  Problem Relation Age of Onset  . Hypertension Mother   . Dementia Mother   . Prostate cancer Father   . Coronary artery disease Father   . Cancer Father        lung cancer  . Heart disease Father        CAD/CABG  . Healthy Maternal Grandmother   . Testicular cancer Maternal Grandfather   . Healthy Paternal Grandmother   . Hiatal hernia Paternal Grandfather   . Diabetes Neg Hx   . Heart attack Neg Hx   . Colon cancer Neg Hx   . Hyperlipidemia Neg Hx   . Rectal cancer Neg Hx   . Stomach cancer Neg Hx    Social History   Socioeconomic History  . Marital status: Married    Spouse name: Not on file  . Number of children: 2  . Years of education: 32  . Highest education level: Not on file  Occupational History  . 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 Occupation: Geophysicist/field seismologist for [employer]  . Financial resource strain: Not hard  at all  . Food insecurity:    Worry: Never true    Inability: Never true  . Transportation needs:    Medical: No    Non-medical: No  Tobacco Use  . Smoking status: Former Smoker    Packs/day: 1.00    Years: 22.00    Pack years: 22.00    Types: Cigarettes    Last attempt to quit: 06/26/2002    Years since quitting: 16.0  . Smokeless tobacco: Never Used  Substance and Sexual Activity  . Alcohol use: Yes    Alcohol/week: 10.0 standard drinks    Types: 3 Glasses of wine, 2 Cans of beer, 5 Standard drinks or equivalent per week    Comment: social  . Drug use: No  . Sexual activity: Yes    Partners: Female  Lifestyle  . Physical activity:    Days per week: 6 days    Minutes per session: 40 min  . Stress: Not at all  Relationships  . Social connections:    Talks on phone: More  than three times a week    Gets together: More than three times a week    Attends religious service: More than 4 times per year    Active member of club or organization: Yes    Attends meetings of clubs or organizations: More than 4 times per year    Relationship status: Married  Other Topics Concern  . Not on file  Social History Narrative   HSG, 2 yrs [employer]. Married '71. 2 daughters - '74, '77; 2 grandchildren. Marriage in [place]. Retired -but working part-time for [employer]. POA wife. Full resuscitation, short-term mechanical ventilation.      Fun: Work in the yard, fishing at [employer]   Denies religious beliefs effecting health care.           Outpatient Encounter Medications as of 07/25/2018  Medication Sig  . Flax Oil-Fish Oil-Borage Oil (FISH OIL-FLAX OIL-BORAGE OIL) CAPS Take by mouth. Take 2 capsules bid  . fluticasone (FLONASE) 50 MCG/ACT nasal spray Place 2 sprays into both nostrils daily.  . NON FORMULARY Herbal blend for prostate-Take one capsule bid  . SELENIUM PO Take by mouth.  . senna-docusate (SENOKOT S) 8.6-50 MG tablet Take 1 tablet by mouth at bedtime as needed.  . vitamin C (ASCORBIC ACID) 500 MG tablet Take 500 mg by mouth 2 (two) times daily.  . Vitamin D, Cholecalciferol, 1000 units CAPS Take by mouth.  . vitamin E 100 UNIT capsule Take by mouth daily.  Marland Kitchen zinc gluconate 50 MG tablet Take 50 mg by mouth daily.   No facility-administered encounter medications on file as of 07/25/2018.     Activities of Daily Living In your present state of health, do you have any difficulty performing the following activities: 07/25/2018  Hearing? N  Vision? N  Difficulty concentrating or making decisions? N  Walking or climbing stairs? N  Dressing or bathing? N  Doing errands, shopping? N  Preparing Food and eating ? N  Using the Toilet? N  In the past six months, have you accidently leaked urine? N  Do you have problems with loss of bowel control? N    Managing your Medications? N  Managing your Finances? N  Housekeeping or managing your Housekeeping? N  Some recent data might be hidden    Patient Care Team: Binnie Rail, MD as PCP - General (Internal Medicine) Gatha Mayer, MD as Consulting Physician (Gastroenterology)  Assessment:   This is a routine wellness examination for Elaine. Physical assessment deferred to PCP.  Additional risk factors are reflected in the social history.   Exercise Activities and Dietary recommendations Current Exercise Habits: Home exercise routine, Type of exercise: walking, Time (Minutes): 40, Frequency (Times/Week): 6, Weekly Exercise (Minutes/Week): 240, Intensity: Mild, Exercise limited by: None identified  Diet (meal preparation, eat out, water intake, caffeinated beverages, dairy products, fruits and vegetables): in general, a "healthy" diet  , well balanced   Reviewed heart healthy and diabetic diet. Encouraged patient to increase daily water and healthy fluid intake.  Goals    . Patient Stated     Stay as healthy and as independent as possible. Enjoy life and travel to [place] with family.       Fall Risk Fall Risk  07/25/2018 04/03/2018 04/03/2018 10/19/2016 06/09/2015  Falls in the past year? No No No No No    Depression Screen PHQ 2/9 Scores 07/25/2018 10/19/2016 06/09/2015  PHQ - 2 Score 0 0 0    Cognitive Function       Ad8 score reviewed for issues:  Issues making decisions: no  Less interest in hobbies / activities: no  Repeats questions, stories (family complaining): no  Trouble using ordinary gadgets (microwave, computer, phone):no  Forgets the month or year: no  Mismanaging finances: no  Remembering appts: no  Daily problems with thinking and/or memory: no Ad8 score is= 0  Immunization History  Administered Date(s) Administered  . Pneumococcal Conjugate-13 06/09/2015  . Pneumococcal Polysaccharide-23 10/19/2016  . Tdap 10/19/2016   Screening  Tests Health Maintenance  Topic Date Due  . INFLUENZA VACCINE  07/28/2019 (Originally 06/06/2018)  . COLONOSCOPY  06/29/2023  . TETANUS/TDAP  10/19/2026  . Hepatitis C Screening  Completed  . PNA vac Low Risk Adult  Completed      Plan:     Continue doing brain stimulating activities (puzzles, reading, adult coloring books, staying active) to keep memory sharp.   Continue to eat heart healthy diet (full of fruits, vegetables, whole grains, lean protein, water--limit salt, fat, and sugar intake) and increase physical activity as tolerated.  I have personally reviewed and noted the following in the patient's chart:   . Medical and social history . Use of alcohol, tobacco or illicit drugs  . Current medications and supplements . Functional ability and status . Nutritional status . Physical activity . Advanced directives . List of other physicians . Vitals . Screenings to include cognitive, depression, and falls . Referrals and appointments  In addition, I have reviewed and discussed with patient certain preventive protocols, quality metrics, and best practice recommendations. A written personalized care plan for preventive services as well as general preventive health recommendations were provided to patient.     Michiel Cowboy, RN  07/25/2018    Medical screening examination/treatment/procedure(s) were performed by non-physician practitioner and as supervising physician I was immediately available for consultation/collaboration. I agree with above. Binnie Rail, MD

## 2018-07-25 ENCOUNTER — Encounter: Payer: Self-pay | Admitting: Internal Medicine

## 2018-07-25 ENCOUNTER — Ambulatory Visit (INDEPENDENT_AMBULATORY_CARE_PROVIDER_SITE_OTHER): Payer: PPO | Admitting: Internal Medicine

## 2018-07-25 ENCOUNTER — Ambulatory Visit (INDEPENDENT_AMBULATORY_CARE_PROVIDER_SITE_OTHER): Payer: PPO | Admitting: *Deleted

## 2018-07-25 VITALS — BP 128/82 | HR 84 | Temp 98.1°F | Resp 17 | Ht 71.0 in | Wt 212.0 lb

## 2018-07-25 DIAGNOSIS — Z125 Encounter for screening for malignant neoplasm of prostate: Secondary | ICD-10-CM | POA: Diagnosis not present

## 2018-07-25 DIAGNOSIS — Z0000 Encounter for general adult medical examination without abnormal findings: Secondary | ICD-10-CM

## 2018-07-25 DIAGNOSIS — R9389 Abnormal findings on diagnostic imaging of other specified body structures: Secondary | ICD-10-CM | POA: Diagnosis not present

## 2018-07-25 DIAGNOSIS — R945 Abnormal results of liver function studies: Secondary | ICD-10-CM | POA: Diagnosis not present

## 2018-07-25 DIAGNOSIS — R7303 Prediabetes: Secondary | ICD-10-CM

## 2018-07-25 DIAGNOSIS — E782 Mixed hyperlipidemia: Secondary | ICD-10-CM | POA: Diagnosis not present

## 2018-07-25 DIAGNOSIS — R7989 Other specified abnormal findings of blood chemistry: Secondary | ICD-10-CM

## 2018-07-25 NOTE — Patient Instructions (Addendum)
Continue doing brain stimulating activities (puzzles, reading, adult coloring books, staying active) to keep memory sharp.   Continue to eat heart healthy diet (full of fruits, vegetables, whole grains, lean protein, water--limit salt, fat, and sugar intake) and increase physical activity as tolerated.   [name] , Thank you for taking time to come for your Medicare Wellness Visit. I appreciate your ongoing commitment to your health goals. Please review the following plan we discussed and let me know if I can assist you in the future.   These are the goals we discussed: Goals    . Patient Stated     Stay as healthy and as independent as possible. Enjoy life and travel to [place] with family.       This is a list of the screening recommended for you and due dates:  Health Maintenance  Topic Date Due  . Flu Shot  06/06/2018  . Colon Cancer Screening  06/29/2023  . Tetanus Vaccine  10/19/2026  .  Hepatitis C: One time screening is recommended by Center for Disease Control  (CDC) for  adults born from [birth year] through 1967.   Completed  . Pneumonia vaccines  Completed     Health Maintenance, Male A healthy lifestyle and preventive care is important for your health and wellness. Ask your health care provider about what schedule of regular examinations is right for you. What should I know about weight and diet? Eat a Healthy Diet  Eat plenty of vegetables, fruits, whole grains, low-fat dairy products, and lean protein.  Do not eat a lot of foods high in solid fats, added sugars, or salt.  Maintain a Healthy Weight Regular exercise can help you achieve or maintain a healthy weight. You should:  Do at least 150 minutes of exercise each week. The exercise should increase your heart rate and make you sweat (moderate-intensity exercise).  Do strength-training exercises at least twice a week.  Watch Your Levels of Cholesterol and Blood Lipids  Have your blood tested for lipids and  cholesterol every 5 years starting at 69 years of age. If you are at high risk for heart disease, you should start having your blood tested when you are 69 years old. You may need to have your cholesterol levels checked more often if: ? Your lipid or cholesterol levels are high. ? You are older than 69 years of age. ? You are at high risk for heart disease.  What should I know about cancer screening? Many types of cancers can be detected early and may often be prevented. Lung Cancer  You should be screened every year for lung cancer if: ? You are a current smoker who has smoked for at least 30 years. ? You are a former smoker who has quit within the past 15 years.  Talk to your health care provider about your screening options, when you should start screening, and how often you should be screened.  Colorectal Cancer  Routine colorectal cancer screening usually begins at 69 years of age and should be repeated every 5-10 years until you are 69 years old. You may need to be screened more often if early forms of precancerous polyps or small growths are found. Your health care provider may recommend screening at an earlier age if you have risk factors for colon cancer.  Your health care provider may recommend using home test kits to check for hidden blood in the stool.  A small camera at the end of a tube can be  used to examine your colon (sigmoidoscopy or colonoscopy). This checks for the earliest forms of colorectal cancer.  Prostate and Testicular Cancer  Depending on your age and overall health, your health care provider may do certain tests to screen for prostate and testicular cancer.  Talk to your health care provider about any symptoms or concerns you have about testicular or prostate cancer.  Skin Cancer  Check your skin from head to toe regularly.  Tell your health care provider about any new moles or changes in moles, especially if: ? There is a change in a mole's size, shape,  or color. ? You have a mole that is larger than a pencil eraser.  Always use sunscreen. Apply sunscreen liberally and repeat throughout the day.  Protect yourself by wearing long sleeves, pants, a wide-brimmed hat, and sunglasses when outside.  What should I know about heart disease, diabetes, and high blood pressure?  If you are 68-55 years of age, have your blood pressure checked every 3-5 years. If you are 51 years of age or older, have your blood pressure checked every year. You should have your blood pressure measured twice-once when you are at a hospital or clinic, and once when you are not at a hospital or clinic. Record the average of the two measurements. To check your blood pressure when you are not at a hospital or clinic, you can use: ? An automated blood pressure machine at a pharmacy. ? A home blood pressure monitor.  Talk to your health care provider about your target blood pressure.  If you are between 34-71 years old, ask your health care provider if you should take aspirin to prevent heart disease.  Have regular diabetes screenings by checking your fasting blood sugar level. ? If you are at a normal weight and have a low risk for diabetes, have this test once every three years after the age of 60. ? If you are overweight and have a high risk for diabetes, consider being tested at a younger age or more often.  A one-time screening for abdominal aortic aneurysm (AAA) by ultrasound is recommended for men aged 63-75 years who are current or former smokers. What should I know about preventing infection? Hepatitis B If you have a higher risk for hepatitis B, you should be screened for this virus. Talk with your health care provider to find out if you are at risk for hepatitis B infection. Hepatitis C Blood testing is recommended for:  Everyone born from [birth year] through 1967.  Anyone with known risk factors for hepatitis C.  Sexually Transmitted Diseases (STDs)  You should  be screened each year for STDs including gonorrhea and chlamydia if: ? You are sexually active and are younger than 69 years of age. ? You are older than 69 years of age and your health care provider tells you that you are at risk for this type of infection. ? Your sexual activity has changed since you were last screened and you are at an increased risk for chlamydia or gonorrhea. Ask your health care provider if you are at risk.  Talk with your health care provider about whether you are at high risk of being infected with HIV. Your health care provider may recommend a prescription medicine to help prevent HIV infection.  What else can I do?  Schedule regular health, dental, and eye exams.  Stay current with your vaccines (immunizations).  Do not use any tobacco products, such as cigarettes, chewing tobacco, and e-cigarettes. If  you need help quitting, ask your health care provider.  Limit alcohol intake to no more than 2 drinks per day. One drink equals 12 ounces of beer, 5 ounces of Bilan Tedesco, or 1 ounces of hard liquor.  Do not use street drugs.  Do not share needles.  Ask your health care provider for help if you need support or information about quitting drugs.  Tell your health care provider if you often feel depressed.  Tell your health care provider if you have ever been abused or do not feel safe at home. This information is not intended to replace advice given to you by your health care provider. Make sure you discuss any questions you have with your health care provider. Document Released: 04/20/2008 Document Revised: 06/21/2016 Document Reviewed: 07/27/2015 Elsevier Interactive Patient Education  [employer].

## 2018-07-25 NOTE — Assessment & Plan Note (Signed)
Lipid panel slightly elevated Exercising regularly Eat healthy and not currently on any medication Lipid panel, TSH, CMP

## 2018-07-25 NOTE — Assessment & Plan Note (Signed)
CMP Encourage weight loss

## 2018-07-25 NOTE — Assessment & Plan Note (Signed)
Check a1c Low sugar / carb diet Stressed regular exercise, weight loss  

## 2018-07-25 NOTE — Assessment & Plan Note (Signed)
Recent dental x-ray showed possible carotid artery calcifications Will order carotid ultrasound-no bruits on exam

## 2018-07-29 ENCOUNTER — Other Ambulatory Visit: Payer: Self-pay | Admitting: Internal Medicine

## 2018-07-29 DIAGNOSIS — I6523 Occlusion and stenosis of bilateral carotid arteries: Secondary | ICD-10-CM

## 2018-07-29 DIAGNOSIS — R9389 Abnormal findings on diagnostic imaging of other specified body structures: Secondary | ICD-10-CM

## 2018-08-01 ENCOUNTER — Ambulatory Visit (HOSPITAL_COMMUNITY)
Admission: RE | Admit: 2018-08-01 | Discharge: 2018-08-01 | Disposition: A | Discharge status: Home / Self Care | Payer: PPO | Source: Ambulatory Visit | Attending: Cardiology | Admitting: Cardiology

## 2018-08-01 DIAGNOSIS — I6523 Occlusion and stenosis of bilateral carotid arteries: Secondary | ICD-10-CM | POA: Insufficient documentation

## 2018-08-01 DIAGNOSIS — R9389 Abnormal findings on diagnostic imaging of other specified body structures: Secondary | ICD-10-CM | POA: Diagnosis not present

## 2018-08-09 ENCOUNTER — Ambulatory Visit: Payer: PPO | Admitting: Internal Medicine

## 2019-02-03 ENCOUNTER — Ambulatory Visit (INDEPENDENT_AMBULATORY_CARE_PROVIDER_SITE_OTHER): Payer: PPO | Admitting: Internal Medicine

## 2019-02-03 ENCOUNTER — Encounter: Payer: Self-pay | Admitting: Internal Medicine

## 2019-02-03 ENCOUNTER — Ambulatory Visit: Payer: Self-pay | Admitting: *Deleted

## 2019-02-03 DIAGNOSIS — R509 Fever, unspecified: Secondary | ICD-10-CM | POA: Insufficient documentation

## 2019-02-03 NOTE — Telephone Encounter (Signed)
Yes, virtual visit

## 2019-02-03 NOTE — Telephone Encounter (Signed)
Appointment today at 3:30

## 2019-02-03 NOTE — Assessment & Plan Note (Signed)
Persistent very mild low-grade fever for the past week then initiated with what sounds like a GI infection At this point he has no other concerning symptoms Nausea, vomiting and stomach pain have resolved and his appetite is back to normal.  He is having normal bowel movements Discussed that this is likely from a viral infection.  Could be a GI infection, less likely coronavirus Since he is feeling better and only has a mild low-grade fever no further evaluation or treatment is needed He will continue to monitor his fever Continue good rest, fluid intake and diet He will call if his symptoms change, he develops new symptoms or his fever persists  He will call with any questions or concerns

## 2019-02-03 NOTE — Telephone Encounter (Signed)
Attempted to contact pt; left message on voicemail [phone]; pt of Dr Billey Gosling; will route to office for final disposition.

## 2019-02-03 NOTE — Telephone Encounter (Signed)
Summary: Fever   Pt called and left a voice mail that he is running a fever and would like to know what he should do. Please advise      Left message to call back on voice mail

## 2019-02-03 NOTE — Telephone Encounter (Signed)
Left message to call back on voice mail

## 2019-02-03 NOTE — Progress Notes (Signed)
Virtual Visit via Video Note  I connected with Richard Novak on 02/03/19 at  3:30 PM EDT by a video enabled telemedicine application and verified that I am speaking with the correct person using two identifiers.   I discussed the limitations of evaluation and management by telemedicine and the availability of in person appointments. The patient expressed understanding and agreed to proceed.  The patient is currently at home and I am in the office.    No referring provider.     History of Present Illness: He is here for an acute visit for a fever.  His symptoms started 1 week ago.  That night he started having upper GI symptoms.  That felt like severe heartburn, but it kept getting worse as the night went on.  His stomach felt all knotted up.  He had nausea and vomited slightly.  Overnight that night he had 4 episodes of severe vomiting and felt like he completely emptied out his GI system.  The vomit was dark brown in color and there was no blood.  He denies any diarrhea.  The next day he had little appetite and ate soup and pudding.  He was able to tolerate that and slowly has increased his food.  His appetite returned to normal 2 days ago.  He has had normal bowel movements for the past 3 days or so.  His concern is that he has had a persistent low-grade fever since this started.  His base temperature is around 97.7.  His temperature has been half to a full degree more depending on when he checks it during the day.  He has had some sinus pressure and watery eyes along with mild nasal congestion that he attributes to his normal seasonal allergies.  He takes Claritin and Flonase as needed.  This is not unusual for him.  He denies any sore throat, ear pain, headaches, coughing, wheezing and shortness of breath.  He has had some mild lightheadedness when his fever increases.  His energy level has improved.  He was just concerned about the fever and if he needs to do anything.      Observations/Objective: He appears well and is in no acute distress.  Assessment and Plan:  See Problem List for Assessment and Plan of chronic medical problems.   Follow Up Instructions:    I discussed the assessment and treatment plan with the patient. The patient was provided an opportunity to ask questions and all were answered. The patient agreed with the plan and demonstrated an understanding of the instructions.   The patient was advised to call back or seek an in-person evaluation if the symptoms worsen or if the condition fails to improve as anticipated.     Binnie Rail, MD

## 2019-02-03 NOTE — Telephone Encounter (Signed)
Virtual visit 

## 2019-08-25 ENCOUNTER — Telehealth: Payer: Self-pay

## 2019-08-25 ENCOUNTER — Other Ambulatory Visit: Payer: Self-pay

## 2019-08-25 DIAGNOSIS — Z20822 Contact with and (suspected) exposure to covid-19: Secondary | ICD-10-CM

## 2019-08-25 NOTE — Telephone Encounter (Signed)
Copied from [place] [id number]. Topic: General - Inquiry >> Aug 25, 2019  8:31 AM Mathis Bud wrote: Reason for CRM: Patient has been running a low grade fever since last Thursday 10/16. Patient stated he is having sinus drainage. Patient is going to go get tested this morning.  He wanted PCP to know. Patient is also request PCP nurse to give him a call back.  Patient call back [phone]

## 2019-08-25 NOTE — Telephone Encounter (Signed)
LVM for pt to call back in regards. Did advise in the VM we can order a test or he can just go get tested at the [facility] location.

## 2019-08-27 LAB — NOVEL CORONAVIRUS, NAA: SARS-CoV-2, NAA: DETECTED — AB

## 2019-08-28 ENCOUNTER — Encounter: Payer: Self-pay | Admitting: Internal Medicine

## 2019-08-28 NOTE — Telephone Encounter (Signed)
I have written this note. Can you print and fax to the number listed below please. Thank you so much!

## 2019-08-28 NOTE — Telephone Encounter (Signed)
Delice Bison as completed a letter for pt.

## 2019-08-28 NOTE — Telephone Encounter (Signed)
Patient called in stating he would like a work note stating he is positive for covid-19 and cannot return to work till negative test results. Please advise. Fax note to [phone], attention: Miranda.

## 2019-08-28 NOTE — Telephone Encounter (Signed)
Letter has been printed and faxed.

## 2019-09-03 ENCOUNTER — Ambulatory Visit: Payer: Self-pay | Admitting: *Deleted

## 2019-09-03 NOTE — Telephone Encounter (Signed)
  Message from Rutherford Nail, [place] sent at 09/03/2019 1:08 PM EDT  Summary: covid / isolation question   Patient calling and states that he is on day 12 of COVID isolation. States that His temperature will not stay down and he is coughing a little more. Denies any trouble breathing. Would like to know how much longer it could be before he can come out of isolation? Please advise.          Patient called and left voicemail to return call x 2.

## 2019-09-04 NOTE — Telephone Encounter (Signed)
He may benefit from a virtual visit with someone today.

## 2019-09-04 NOTE — Progress Notes (Signed)
Virtual Visit via Video Note  I connected with Richard Novak on 09/04/19 at 10:00 AM EDT by a video enabled telemedicine application and verified that I am speaking with the correct person using two identifiers.   I discussed the limitations of evaluation and management by telemedicine and the availability of in person appointments. The patient expressed understanding and agreed to proceed.  The patient is currently at home and I am in the office.    No referring provider.    History of Present Illness: This is an acute visit for COVID symptoms.    His test for COVID was positive 10/19   He started with symptom 13 days ago.  He is still having an elevated temperature and he is coughing.  He knows his baseline temperature and his temperatures are still in the low grade level.  He is taking acetaminophen alternating with ibuprofen.  He is also experiencing drainage in his throat, fatigue, occasional cough from the drainage and occasional headaches.  His appetite is normal and he denies any loss of taste or smell.  He denies shortness of breath, wheezing, body aches.  He does wake up sweating overnight or in the morning.  He is concerned about waiting he is able to return to work.   Review of Systems  Constitutional: Positive for fever and malaise/fatigue.       Appetite  HENT: Positive for sore throat (from drainage only).        No loss of taste or smell Mucus in throat  Respiratory: Positive for cough (1-2 times a day). Negative for shortness of breath and wheezing.   Musculoskeletal: Negative for myalgias.  Neurological: Positive for headaches (one every 2-3 days).     Social History   Socioeconomic History  . Marital status: Married    Spouse name: Not on file  . Number of children: 2  . Years of education: 23  . Highest education level: Not on file  Occupational History  . 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 Occupation: Geophysicist/field seismologist for [employer]  . Financial resource strain: Not hard at all  . Food insecurity    Worry: Never true    Inability: Never true  . Transportation needs    Medical: No    Non-medical: No  Tobacco Use  . Smoking status: Former Smoker    Packs/day: 1.00    Years: 22.00    Pack years: 22.00    Types: Cigarettes    Quit date: 06/26/2002    Years since quitting: 17.2  . Smokeless tobacco: Never Used  Substance and Sexual Activity  . Alcohol use: Yes    Alcohol/week: 10.0 standard drinks    Types: 3 Glasses of wine, 2 Cans of beer, 5 Standard drinks or equivalent per week    Comment: social - 3-4 days a week  . Drug use: No  . Sexual activity: Yes    Partners: Female  Lifestyle  . Physical activity    Days per week: 6 days    Minutes per session: 40 min  . Stress: Not at all  Relationships  . Social connections    Talks on phone: More than three times a week    Gets together: More than three times a week    Attends religious service: More than 4 times per year    Active member of club or organization: Yes  Attends meetings of clubs or organizations: More than 4 times per year    Relationship status: Married  Other Topics Concern  . Not on file  Social History Narrative   HSG, 2 yrs [employer]. Married '71. 2 daughters - '74, '77; 2 grandchildren. Marriage in [place]. Retired -but working part-time for [employer]. POA wife. Full resuscitation, short-term mechanical ventilation.      Fun: Work in the yard, fishing at [employer]   Denies religious beliefs effecting health care.            Observations/Objective: Appears well in NAD Breathing normally Skin appears warm and dry Normal mood  Assessment and Plan:  See Problem List for Assessment and Plan of chronic medical problems.   Follow Up Instructions:    I discussed the assessment and treatment plan with the patient. The patient was  provided an opportunity to ask questions and all were answered. The patient agreed with the plan and demonstrated an understanding of the instructions.   The patient was advised to call back or seek an in-person evaluation if the symptoms worsen or if the condition fails to improve as anticipated.    Binnie Rail, MD

## 2019-09-05 ENCOUNTER — Ambulatory Visit (INDEPENDENT_AMBULATORY_CARE_PROVIDER_SITE_OTHER): Payer: PPO | Admitting: Internal Medicine

## 2019-09-05 ENCOUNTER — Encounter: Payer: Self-pay | Admitting: Internal Medicine

## 2019-09-05 DIAGNOSIS — U071 COVID-19: Secondary | ICD-10-CM

## 2019-09-05 NOTE — Assessment & Plan Note (Signed)
Tested on 08/25/2019, which was positive Still having some low-grade fevers, cough.  Also has some postnasal drip and occasional cough, occasional headaches Symptoms are improving Taking Tylenol alternating with ibuprofen and either Mucinex or Sudafed Continue symptomatic treatment Discussed with symptoms are not uncommon to last 2 weeks and can last longer May have residual fatigue for a while Discussed that he cannot return to work until he has had at least 24 hours without fever without the use of Tylenol/ibuprofen  He will call with any questions or concerns

## 2019-09-12 ENCOUNTER — Telehealth: Payer: Self-pay

## 2019-09-12 NOTE — Telephone Encounter (Signed)
Letter written pt aware and will access this through my chart.

## 2019-09-12 NOTE — Telephone Encounter (Signed)
Copied from [place] [id number]. Topic: General - Other >> Sep 11, 2019  2:55 PM Yvette Rack wrote: Reason for CRM: Pt stated he needs a return to work letter for him to return back to work on 09/15/19.

## 2019-09-12 NOTE — Telephone Encounter (Signed)
Ok to write? 

## 2019-09-12 NOTE — Telephone Encounter (Signed)
Yes, ok 

## 2019-09-29 ENCOUNTER — Other Ambulatory Visit: Payer: Self-pay

## 2020-02-13 ENCOUNTER — Ambulatory Visit (INDEPENDENT_AMBULATORY_CARE_PROVIDER_SITE_OTHER): Payer: PPO | Admitting: Internal Medicine

## 2020-02-13 ENCOUNTER — Other Ambulatory Visit: Payer: Self-pay

## 2020-02-13 ENCOUNTER — Encounter: Payer: Self-pay | Admitting: Internal Medicine

## 2020-02-13 VITALS — BP 112/72 | HR 88 | Temp 98.4°F | Ht 71.0 in | Wt 200.0 lb

## 2020-02-13 DIAGNOSIS — L989 Disorder of the skin and subcutaneous tissue, unspecified: Secondary | ICD-10-CM | POA: Diagnosis not present

## 2020-02-13 DIAGNOSIS — R7303 Prediabetes: Secondary | ICD-10-CM

## 2020-02-13 DIAGNOSIS — R079 Chest pain, unspecified: Secondary | ICD-10-CM

## 2020-02-13 DIAGNOSIS — E782 Mixed hyperlipidemia: Secondary | ICD-10-CM | POA: Diagnosis not present

## 2020-02-13 DIAGNOSIS — R7989 Other specified abnormal findings of blood chemistry: Secondary | ICD-10-CM

## 2020-02-13 DIAGNOSIS — R399 Unspecified symptoms and signs involving the genitourinary system: Secondary | ICD-10-CM | POA: Diagnosis not present

## 2020-02-13 LAB — CBC WITH DIFFERENTIAL/PLATELET
Basophils Absolute: 0.1 10*3/uL (ref 0.0–0.1)
Basophils Relative: 0.9 % (ref 0.0–3.0)
Eosinophils Absolute: 0.2 10*3/uL (ref 0.0–0.7)
Eosinophils Relative: 2.9 % (ref 0.0–5.0)
HCT: 44.9 % (ref 39.0–52.0)
Hemoglobin: 15.2 g/dL (ref 13.0–17.0)
Lymphocytes Relative: 26.7 % (ref 12.0–46.0)
Lymphs Abs: 1.9 10*3/uL (ref 0.7–4.0)
MCHC: 33.8 g/dL (ref 30.0–36.0)
MCV: 93.5 fl (ref 78.0–100.0)
Monocytes Absolute: 0.5 10*3/uL (ref 0.1–1.0)
Monocytes Relative: 7 % (ref 3.0–12.0)
Neutro Abs: 4.5 10*3/uL (ref 1.4–7.7)
Neutrophils Relative %: 62.5 % (ref 43.0–77.0)
Platelets: 206 10*3/uL (ref 150.0–400.0)
RBC: 4.81 Mil/uL (ref 4.22–5.81)
RDW: 13.5 % (ref 11.5–15.5)
WBC: 7.2 10*3/uL (ref 4.0–10.5)

## 2020-02-13 LAB — COMPREHENSIVE METABOLIC PANEL
ALT: 27 U/L (ref 0–53)
AST: 22 U/L (ref 0–37)
Albumin: 4.4 g/dL (ref 3.5–5.2)
Alkaline Phosphatase: 59 U/L (ref 39–117)
BUN: 25 mg/dL — ABNORMAL HIGH (ref 6–23)
CO2: 23 mEq/L (ref 19–32)
Calcium: 10 mg/dL (ref 8.4–10.5)
Chloride: 104 mEq/L (ref 96–112)
Creatinine, Ser: 1.09 mg/dL (ref 0.40–1.50)
GFR: 66.8 mL/min (ref >60.00)
Glucose, Bld: 99 mg/dL (ref 70–99)
Potassium: 4.2 mEq/L (ref 3.5–5.1)
Sodium: 137 mEq/L (ref 135–145)
Total Bilirubin: 0.3 mg/dL (ref 0.2–1.2)
Total Protein: 6.7 g/dL (ref 6.0–8.3)

## 2020-02-13 LAB — HEMOGLOBIN A1C: Hgb A1c MFr Bld: 5.9 % (ref 4.6–6.5)

## 2020-02-13 LAB — LIPID PANEL
Cholesterol: 196 mg/dL (ref 0–200)
HDL: 39.6 mg/dL (ref >39.00)
NonHDL: 156.69
Total CHOL/HDL Ratio: 5
Triglycerides: 272 mg/dL — ABNORMAL HIGH (ref 0.0–149.0)
VLDL: 54.4 mg/dL — ABNORMAL HIGH (ref 0.0–40.0)

## 2020-02-13 LAB — LDL CHOLESTEROL, DIRECT: Direct LDL: 128 mg/dL

## 2020-02-13 NOTE — Assessment & Plan Note (Signed)
Chronic Check a1c Low sugar / carb diet Stressed regular exercise  

## 2020-02-13 NOTE — Assessment & Plan Note (Signed)
H/o of elevated lfts cmp today

## 2020-02-13 NOTE — Assessment & Plan Note (Addendum)
Atypical chest pain Not typical of prior GERD, no recurrence Has risk factors for CAD  EKG today Labs - cbc, cmp, lipids Refer to cardio EKG today:  NSR at 89 bpm, normal EKG, sinus tach on prior EKG in 2012 is no longer present

## 2020-02-13 NOTE — Patient Instructions (Addendum)
  Blood work was ordered.   An EKG was done.     Medications reviewed and updated.  Changes include : none      A referral was ordered for urology, cardiology       Someone will call you to schedule this.    Please followup in 1 year

## 2020-02-13 NOTE — Progress Notes (Signed)
Subjective:    Patient ID: Richard Novak, male    DOB: Dec 19, 1950, 69 y.o.   MRN: [id number]  HPI The patient is here for follow up of their chronic medical problems, including prediabetes, hyperlipidemia, elevated lfts  He is not exercising regularly.   He does yard work.  He is eating a low carb diet.  He has lost weight - about 15 lbs.   He has a skin abnormality on his right medial thigh. It felt pain there 6 months ago - it felt like a hot poker.  It turned black.    2 weeks ago after lunch he was driving and had intense pain from arm pit to armpit.  Adjusting position would not help.  For 2-3 days after it felt like his heart would miss every 2-3 beats.  This did go away. He denies chest pain since then.    He used to have GERD, but after changing his diet and using a wedge pillow it has resolved.  He always had typical GERD - nothing like this.    His dad had a CABG around 7-72.    Medications and allergies reviewed with patient and updated if appropriate.  Patient Active Problem List   Diagnosis Date Noted  . COVID-19 09/05/2019  . Abnormal x-ray 07/25/2018  . Elevated LFTs 10/22/2016  . Prediabetes 10/22/2016  . Hyperlipidemia 10/19/2016  . Olecranon bursitis of left elbow 09/07/2016  . Personal history of colonic adenomas 03/18/2013  . ROTATOR CUFF INJURY, LEFT SHOULDER 07/16/2009  . URTICARIA, HX OF 07/16/2009    Current Outpatient Medications on File Prior to Visit  Medication Sig Dispense Refill  . Flax Oil-Fish Oil-Borage Oil (FISH OIL-FLAX OIL-BORAGE OIL) CAPS Take by mouth. Take 2 capsules bid    . fluticasone (FLONASE) 50 MCG/ACT nasal spray Place 2 sprays into both nostrils daily. 16 g 6  . NON FORMULARY Herbal blend for prostate-Take one capsule bid    . SELENIUM PO Take by mouth.    . senna-docusate (SENOKOT S) 8.6-50 MG tablet Take 1 tablet by mouth at bedtime as needed. 30 tablet 1  . vitamin C (ASCORBIC ACID) 500 MG tablet Take 500 mg by mouth 2  (two) times daily.    . Vitamin D, Cholecalciferol, 1000 units CAPS Take by mouth.    . vitamin E 100 UNIT capsule Take by mouth daily.    Marland Kitchen ZINC PICOLINATE PO Take 50 mg by mouth.     No current facility-administered medications on file prior to visit.    Past Medical History:  Diagnosis Date  . Bursitis of elbow    left  . Nongonococcal urethritis (NGU) due to other specified organism   . Personal history of colonic adenomas 03/18/2013  . Rotator cuff injury    Left  . Tendinitis of left elbow   . Urticaria     Past Surgical History:  Procedure Laterality Date  . APPENDECTOMY  1968  . COLONOSCOPY    . OLECRANON BURSECTOMY Left 10/04/2016   Procedure: LEFT OLECRANON BURSECTOMY;  Surgeon: Leandrew Koyanagi, MD;  Location: [facility];  Service: Orthopedics;  Laterality: Left;  . ROTATOR CUFF REPAIR  2008   Arthroscopic Repair ([facility])    Social History   Socioeconomic History  . Marital status: Married    Spouse name: Not on file  . Number of children: 2  . Years of education: 72  . Highest education level: Not on file  Occupational History  .  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 Occupation: Geophysicist/field seismologist for [employer]  . Smoking status: Former Smoker    Packs/day: 1.00    Years: 22.00    Pack years: 22.00    Types: Cigarettes    Quit date: 06/26/2002    Years since quitting: 17.6  . Smokeless tobacco: Never Used  Substance and Sexual Activity  . Alcohol use: Yes    Alcohol/week: 10.0 standard drinks    Types: 3 Glasses of wine, 2 Cans of beer, 5 Standard drinks or equivalent per week    Comment: social - 3-4 days a week  . Drug use: No  . Sexual activity: Yes    Partners: Female  Other Topics Concern  . Not on file  Social History Narrative   HSG, 2 yrs [employer]. Married '71. 2 daughters - '74, '77; 2 grandchildren. Marriage in [place]. Retired -but  working part-time for [employer]. POA wife. Full resuscitation, short-term mechanical ventilation.      Fun: Work in the yard, fishing at [employer]   Denies religious beliefs effecting health care.          Social Determinants of Health   Financial Resource Strain:   . Difficulty of Paying Living Expenses:   Food Insecurity:   . Worried About Charity fundraiser in the Last Year:   . Arboriculturist in the Last Year:   Transportation Needs:   . Film/video editor (Medical):   Marland Kitchen Lack of Transportation (Non-Medical):   Physical Activity:   . Days of Exercise per Week:   . Minutes of Exercise per Session:   Stress:   . Feeling of Stress :   Social Connections:   . Frequency of Communication with Friends and Family:   . Frequency of Social Gatherings with Friends and Family:   . Attends Religious Services:   . Active Member of Clubs or Organizations:   . Attends Archivist Meetings:   Marland Kitchen Marital Status:     Family History  Problem Relation Age of Onset  . Hypertension Mother   . Dementia Mother   . Prostate cancer Father   . Coronary artery disease Father   . Cancer Father        lung cancer  . Heart disease Father        CAD/CABG  . Healthy Maternal Grandmother   . Testicular cancer Maternal Grandfather   . Healthy Paternal Grandmother   . Hiatal hernia Paternal Grandfather   . Diabetes Neg Hx   . Heart attack Neg Hx   . Colon cancer Neg Hx   . Hyperlipidemia Neg Hx   . Rectal cancer Neg Hx   . Stomach cancer Neg Hx     Review of Systems  Constitutional: Negative for chills and fever.  Respiratory: Negative for cough, shortness of breath and wheezing.   Cardiovascular: Positive for chest pain (one episode). Negative for palpitations and leg swelling.  Gastrointestinal: Negative for abdominal pain and nausea.       No gerd  Skin: Positive for color change.  Neurological: Negative for dizziness, light-headedness and headaches.         Objective:   Vitals:   02/13/20 1354  BP: 112/72  Pulse: 88  Temp: 98.4 F (36.9 C)  SpO2: 97%   BP Readings from Last 3 Encounters:  02/13/20 112/72  07/25/18 128/82  07/25/18 128/82   Wt Readings from Last 3 Encounters:  02/13/20 200 lb (90.7 kg)  07/25/18 212 lb (96.2 kg)  07/25/18 212 lb (96.2 kg)   Body mass index is 27.89 kg/m.   Physical Exam    Constitutional: Appears well-developed and well-nourished. No distress.  HENT:  Head: Normocephalic and atraumatic.  Neck: Neck supple. No tracheal deviation present. No thyromegaly present.  No cervical lymphadenopathy Cardiovascular: Normal rate, regular rhythm with occ premature beat and normal heart sounds.   No murmur heard. No carotid bruit .  No edema Pulmonary/Chest: Effort normal and breath sounds normal. No respiratory distress. No has no wheezes. No rales.  Abd: soft, NT, ND Skin: Skin is warm and dry. Not diaphoretic.  exotropic lesion left cheek, dry, raised ? Keratosis right medial thigh Psychiatric: Normal mood and affect. Behavior is normal.      Assessment & Plan:    See Problem List for Assessment and Plan of chronic medical problems.    This visit occurred during the SARS-CoV-2 public health emergency.  Safety protocols were in place, including screening questions prior to the visit, additional usage of staff PPE, and extensive cleaning of exam room while observing appropriate contact time as indicated for disinfecting solutions.

## 2020-02-13 NOTE — Assessment & Plan Note (Signed)
?   Precancerous lesions Will see derm

## 2020-02-13 NOTE — Assessment & Plan Note (Signed)
Chronic Check lipid panel, cmp Lifestyle controlled Regular exercise and healthy diet encouraged  

## 2020-02-15 ENCOUNTER — Encounter: Payer: Self-pay | Admitting: Internal Medicine

## 2020-03-10 NOTE — Progress Notes (Signed)
Chief Complaint  Patient presents with  . New Patient (Initial Visit)    Chest pain     History of Present Illness: 69 yo male with history of mild carotid artery disease who is here today as a new consult for the evaluation of chest pain.  EKG 03/01/20 in primary care with sinus rhythm and no ischemic changes. Carotid artery dopplers September 2019 with mild disease. He tells me today that he had an episode of severe chest pain with bandlike tightness across his chest one month ago. This dit not imrpve with positional changes. This lasted for three minutes. No associated dyspnea, dizziness, diaphoresis, palpitations. Over the next few days he had some palpitations. His father had CAD and needed CABG in his early 36s.   Primary Care Physician: Binnie Rail, MD  Past Medical History:  Diagnosis Date  . Bursitis of elbow    left  . Nongonococcal urethritis (NGU) due to other specified organism   . Personal history of colonic adenomas 03/18/2013  . Rotator cuff injury    Left  . Tendinitis of left elbow   . Urticaria     Past Surgical History:  Procedure Laterality Date  . APPENDECTOMY  1968  . COLONOSCOPY    . OLECRANON BURSECTOMY Left 10/04/2016   Procedure: LEFT OLECRANON BURSECTOMY;  Surgeon: Leandrew Koyanagi, MD;  Location: [facility];  Service: Orthopedics;  Laterality: Left;  . ROTATOR CUFF REPAIR  2008   Arthroscopic Repair ([facility])    Current Outpatient Medications  Medication Sig Dispense Refill  . Flax Oil-Fish Oil-Borage Oil (FISH OIL-FLAX OIL-BORAGE OIL) CAPS Take by mouth. Take 2 capsules bid    . fluticasone (FLONASE) 50 MCG/ACT nasal spray Place 2 sprays into both nostrils as needed for allergies or rhinitis.    . NON FORMULARY Herbal blend for prostate-Take one capsule bid    . SELENIUM PO Take by mouth.    . senna-docusate (SENOKOT S) 8.6-50 MG tablet Take 1 tablet by mouth at bedtime as needed. 30 tablet 1  . vitamin C (ASCORBIC ACID) 500 MG tablet  Take 500 mg by mouth 2 (two) times daily.    . Vitamin D, Cholecalciferol, 1000 units CAPS Take by mouth.    . vitamin E 100 UNIT capsule Take by mouth daily.    Marland Kitchen ZINC PICOLINATE PO Take 50 mg by mouth.    . metoprolol tartrate (LOPRESSOR) 50 MG tablet Take one tablet by mouth night before CT scan, and take one tablet by mouth 2 hours prior to CT scan 2 tablet 0   No current facility-administered medications for this visit.    Allergies  Allergen Reactions  . Sulfa Antibiotics Nausea Only    Makes me gag!    Social History   Socioeconomic History  . Marital status: Married    Spouse name: Not on file  . Number of children: 2  . Years of education: 17  . Highest education level: Not on file  Occupational History  . 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 Smoking status: Former Smoker    Packs/day: 1.00    Years: 22.00    Pack years: 22.00    Types: Cigarettes    Quit date: 06/26/2002    Years since quitting: 17.7  . Smokeless tobacco: Never Used  Substance and Sexual  Activity  . Alcohol use: Yes    Alcohol/week: 10.0 standard drinks    Types: 3 Glasses of wine, 2 Cans of beer, 5 Standard drinks or equivalent per week    Comment: social - 3-4 days a week  . Drug use: No  . Sexual activity: Yes    Partners: Female  Other Topics Concern  . Not on file  Social History Narrative   HSG, 2 yrs [employer]. Married '71. 2 daughters - '74, '77; 2 grandchildren. Marriage in [place]. Retired -but working part-time for [employer]. POA wife. Full resuscitation, short-term mechanical ventilation.      Fun: Work in the yard, fishing at [employer]   Denies religious beliefs effecting health care.          Social Determinants of Health   Financial Resource Strain:   . Difficulty of Paying Living Expenses:   Food Insecurity:    . Worried About Charity fundraiser in the Last Year:   . Arboriculturist in the Last Year:   Transportation Needs:   . Film/video editor (Medical):   Marland Kitchen Lack of Transportation (Non-Medical):   Physical Activity:   . Days of Exercise per Week:   . Minutes of Exercise per Session:   Stress:   . Feeling of Stress :   Social Connections:   . Frequency of Communication with Friends and Family:   . Frequency of Social Gatherings with Friends and Family:   . Attends Religious Services:   . Active Member of Clubs or Organizations:   . Attends Archivist Meetings:   Marland Kitchen Marital Status:   Intimate Partner Violence:   . Fear of Current or Ex-Partner:   . Emotionally Abused:   Marland Kitchen Physically Abused:   . Sexually Abused:     Family History  Problem Relation Age of Onset  . Hypertension Mother   . Dementia Mother   . Prostate cancer Father   . Coronary artery disease Father   . Cancer Father        lung cancer  . Heart disease Father        CAD/CABG  . Healthy Maternal Grandmother   . Testicular cancer Maternal Grandfather   . Healthy Paternal Grandmother   . Hiatal hernia Paternal Grandfather   . Diabetes Neg Hx   . Heart attack Neg Hx   . Colon cancer Neg Hx   . Hyperlipidemia Neg Hx   . Rectal cancer Neg Hx   . Stomach cancer Neg Hx     Review of Systems:  As stated in the HPI and otherwise negative.   BP 122/70   Pulse 81   Ht 5\' 11"  (1.803 m)   Wt 201 lb 3.2 oz (91.3 kg)   SpO2 97%   BMI 28.06 kg/m   Physical Examination: General: Well developed, well nourished, NAD  HEENT: OP clear, mucus membranes moist  SKIN: warm, dry. No rashes. Neuro: No focal deficits  Musculoskeletal: Muscle strength 5/5 all ext  Psychiatric: Mood and affect normal  Neck: No JVD, no carotid bruits, no thyromegaly, no lymphadenopathy.  Lungs:Clear bilaterally, no wheezes, rhonci, crackles Cardiovascular: Regular rate and rhythm. No murmurs, gallops or rubs. Abdomen:Soft.  Bowel sounds present. Non-tender.  Extremities: No lower extremity edema. Pulses are 2 + in the bilateral DP/PT.  EKG:  EKG is not ordered today. The ekg ordered today demonstrates  EKG from last week reviewed by me and shows sinus with no ischemic  changes  Recent Labs: 02/13/2020: ALT 27; BUN 25; Creatinine, Ser 1.09; Hemoglobin 15.2; Platelets 206.0; Potassium 4.2; Sodium 137   Lipid Panel    Component Value Date/Time   CHOL 196 02/13/2020 1501   TRIG 272.0 (H) 02/13/2020 1501   TRIG 80 09/26/2006 1006   HDL 39.60 02/13/2020 1501   CHOLHDL 5 02/13/2020 1501   VLDL 54.4 (H) 02/13/2020 1501   LDLCALC 108 (H) 10/19/2016 0954   LDLDIRECT 128.0 02/13/2020 1501     Wt Readings from Last 3 Encounters:  03/11/20 201 lb 3.2 oz (91.3 kg)  02/13/20 200 lb (90.7 kg)  07/25/18 212 lb (96.2 kg)     Assessment and Plan:   1. Chest pain:  His pain occurred while at rest but was severe. His risk factors for CAD include age, former tobacco abuse and FH of CAD. Will arrange a coronary CTA to exclude CAD. Will arrange an echo to assess LV size and function and exclude structural heart disease. BMET today.   Current medicines are reviewed at length with the patient today.  The patient does not have concerns regarding medicines.  The following changes have been made:  no change  Labs/ tests ordered today include:   Orders Placed This Encounter  Procedures  . CT CORONARY MORPH W/CTA COR W/SCORE W/CA W/CM &/OR WO/CM  . CT CORONARY FRACTIONAL FLOW RESERVE DATA PREP  . CT CORONARY FRACTIONAL FLOW RESERVE FLUID ANALYSIS  . Basic metabolic panel  . ECHOCARDIOGRAM COMPLETE     Disposition:   FU with me in one year.    Signed, Lauree Chandler, MD 03/11/2020 10:16 AM    [address], [place], [place]  [address] Phone: [phone]; Fax: [phone]

## 2020-03-11 ENCOUNTER — Ambulatory Visit: Payer: PPO | Admitting: Cardiovascular Disease

## 2020-03-11 ENCOUNTER — Other Ambulatory Visit: Payer: Self-pay

## 2020-03-11 ENCOUNTER — Encounter: Payer: Self-pay | Admitting: Cardiovascular Disease

## 2020-03-11 VITALS — BP 122/70 | HR 81 | Ht 71.0 in | Wt 201.2 lb

## 2020-03-11 DIAGNOSIS — R079 Chest pain, unspecified: Secondary | ICD-10-CM

## 2020-03-11 LAB — BASIC METABOLIC PANEL
BUN/Creatinine Ratio: 17 (ref 10–24)
BUN: 25 mg/dL (ref 8–27)
CO2: 22 mmol/L (ref 20–29)
Calcium: 10 mg/dL (ref 8.6–10.2)
Chloride: 105 mmol/L (ref 96–106)
Creatinine, Ser: 1.43 mg/dL — ABNORMAL HIGH (ref 0.76–1.27)
GFR calc Af Amer: 57 mL/min/{1.73_m2} — ABNORMAL LOW (ref >59)
GFR calc non Af Amer: 49 mL/min/{1.73_m2} — ABNORMAL LOW (ref >59)
Glucose: 97 mg/dL (ref 65–99)
Potassium: 4.9 mmol/L (ref 3.5–5.2)
Sodium: 141 mmol/L (ref 134–144)

## 2020-03-11 MED ORDER — METOPROLOL TARTRATE 50 MG PO TABS: ORAL_TABLET | ORAL | 0 refills | Status: DC

## 2020-03-11 NOTE — Patient Instructions (Addendum)
Medication Instructions:  Your physician recommends that you continue on your current medications as directed. Please refer to the Current Medication list given to you today.  *If you need a refill on your cardiac medications before your next appointment, please call your pharmacy*  Lab Work: Your physician recommends that you have lab work today- BMET  If you have labs (blood work) drawn today and your tests are completely normal, you will receive your results only by: Marland Kitchen MyChart Message (if you have MyChart) OR . A paper copy in the mail If you have any lab test that is abnormal or we need to change your treatment, we will call you to review the results.   Testing/Procedures: Your physician has requested that you have cardiac CT. Cardiac computed tomography (CT) is a painless test that uses an x-ray machine to take clear, detailed pictures of your heart. For further information please visit [email or web address]. Please follow instruction sheet as given.  Your physician has requested that you have an echocardiogram. Echocardiography is a painless test that uses sound waves to create images of your heart. It provides your doctor with information about the size and shape of your heart and how well your heart's chambers and valves are working. This procedure takes approximately one hour. There are no restrictions for this procedure.  Follow-Up: At [facility], you and your health needs are our priority.  As part of our continuing mission to provide you with exceptional heart care, we have created designated Provider Care Teams.  These Care Teams include your primary Cardiologist (physician) and Advanced Practice Providers (APPs -  Physician Assistants and Nurse Practitioners) who all work together to provide you with the care you need, when you need it.  We recommend signing up for the patient portal called "MyChart".  Sign up information is provided on this After Visit Summary.  MyChart is used  to connect with patients for Virtual Visits (Telemedicine).  Patients are able to view lab/test results, encounter notes, upcoming appointments, etc.  Non-urgent messages can be sent to your provider as well.   To learn more about what you can do with MyChart, go to [email or web address].    Your next appointment:   1 year(s)  The format for your next appointment:   In Person  Provider:   You may see Dr. Angelena Form or one of the following Advanced Practice Providers on your designated Care Team:    Melina Copa, PA-C  Ermalinda Barrios, PA-C  Your cardiac CT will be scheduled at one of the below locations:   [facility] [address] [place], [place] [address] [phone]  If scheduled at [facility], please arrive at the [facility] main entrance of [facility] 30 minutes prior to test start time. Proceed to the [facility] Radiology Department (first floor) to check-in and test prep.  Please follow these instructions carefully (unless otherwise directed):  Hold all erectile dysfunction medications at least 3 days (72 hrs) prior to test.  On the Night Before the Test: . Be sure to Drink plenty of water. . Do not consume any caffeinated/decaffeinated beverages or chocolate 12 hours prior to your test. . Do not take any antihistamines 12 hours prior to your test. . Take Metoprolol 50 mg the night before procedure  On the Day of the Test: . Drink plenty of water. Do not drink any water within one hour of the test. . Do not eat any food 4 hours prior to the test. .  You may take your regular medications prior to the test.  . Take metoprolol 50 mg (Lopressor) two hours prior to test.      After the Test: . Drink plenty of water. . After receiving IV contrast, you may experience a mild flushed feeling. This is normal. . On occasion, you may experience a mild rash up to 24 hours after the test. This is not dangerous. If this occurs, you can take Benadryl  25 mg and increase your fluid intake. . If you experience trouble breathing, this can be serious. If it is severe call 911 IMMEDIATELY. If it is mild, please call our office.    Once we have confirmed authorization from your insurance company, we will call you to set up a date and time for your test.   For non-scheduling related questions, please contact the cardiac imaging nurse navigator should you have any questions/concerns: Marchia Bond, RN Navigator Cardiac Imaging [facility] [phone] office  For scheduling needs, including cancellations and rescheduling, please call [phone].

## 2020-03-25 DIAGNOSIS — Z8042 Family history of malignant neoplasm of prostate: Secondary | ICD-10-CM | POA: Diagnosis not present

## 2020-03-25 DIAGNOSIS — N528 Other male erectile dysfunction: Secondary | ICD-10-CM | POA: Diagnosis not present

## 2020-03-25 DIAGNOSIS — Z125 Encounter for screening for malignant neoplasm of prostate: Secondary | ICD-10-CM | POA: Diagnosis not present

## 2020-04-02 ENCOUNTER — Other Ambulatory Visit: Payer: Self-pay

## 2020-04-02 ENCOUNTER — Ambulatory Visit (HOSPITAL_COMMUNITY): Payer: PPO | Attending: Cardiology

## 2020-04-02 ENCOUNTER — Telehealth: Payer: Self-pay

## 2020-04-02 DIAGNOSIS — R079 Chest pain, unspecified: Secondary | ICD-10-CM | POA: Diagnosis not present

## 2020-04-02 NOTE — Telephone Encounter (Signed)
The patient came in for an Echocardiogram today and was asking the check out staff for an update about scheduling his CT. This RN called and left patient a message to call us back with questions.   We have not received insurance authorization at this time so we are unable to schedule CT.

## 2020-04-14 ENCOUNTER — Telehealth (HOSPITAL_COMMUNITY): Payer: Self-pay | Admitting: *Deleted

## 2020-04-14 NOTE — Telephone Encounter (Signed)
Pt returning call regarding upcoming cardiac imaging study; pt verbalizes understanding of appt date/time, parking situation and where to check in, pre-test NPO status and medications ordered, and verified current allergies; name and call back number provided for further questions should they arise  Meris Reede Tai RN Navigator Cardiac Imaging [facility] [phone] office [phone] cell  

## 2020-04-14 NOTE — Telephone Encounter (Signed)
Attempted to call patient regarding upcoming cardiac CT appointment. °Left message on voicemail with name and callback number ° °Christy Ehrsam Tai RN Navigator Cardiac Imaging ° [facility] °[phone] Office °[phone] Cell ° °

## 2020-04-15 ENCOUNTER — Ambulatory Visit (HOSPITAL_COMMUNITY)
Admission: RE | Admit: 2020-04-15 | Discharge: 2020-04-15 | Disposition: A | Discharge status: Home / Self Care | Payer: PPO | Source: Ambulatory Visit | Attending: Cardiovascular Disease | Admitting: Cardiovascular Disease

## 2020-04-15 ENCOUNTER — Other Ambulatory Visit: Payer: Self-pay

## 2020-04-15 DIAGNOSIS — R079 Chest pain, unspecified: Secondary | ICD-10-CM | POA: Insufficient documentation

## 2020-04-15 DIAGNOSIS — I2510 Atherosclerotic heart disease of native coronary artery without angina pectoris: Secondary | ICD-10-CM | POA: Diagnosis not present

## 2020-04-15 LAB — POCT I-STAT CREATININE: Creatinine, Ser: 1.3 mg/dL — ABNORMAL HIGH (ref 0.61–1.24)

## 2020-04-15 IMAGING — CT CT HEART MORP W/ CTA COR W/ SCORE W/ CA W/CM &/OR W/O CM
4 of 7 series · 8 of 20 positions shown, 9 images · IV contrast (APPLIED)
Comparison: None.
COMPARISON: None.

Addendum:
EXAM:
OVER-READ INTERPRETATION  CT CHEST

The following report is an over-read performed by radiologist Dr.
Silja Nebojsa Limar Ausfluge [REDACTED] on 04/15/2020. This over-read
does not include interpretation of cardiac or coronary anatomy or
pathology. The coronary CTA interpretation by the cardiologist is
attached.
CLINICAL DATA: 68 year old male with chest pain. Medical history
includes prediabetes and hyperlipidemia.
Cardiac/Coronary  CT
TECHNIQUE: The patient was scanned on a Phillips Force scanner.

[Series 6: best diast 76 % · axial · 0.39mm/px · z∈[-170,-130]mm · 2 of 301 slices shown]
[im 101/301  vessel]
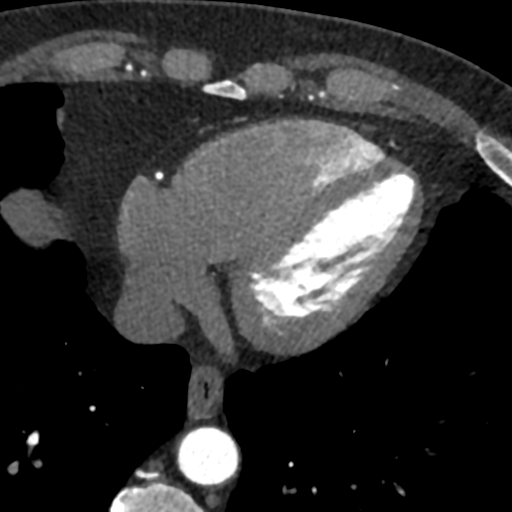
[im 201/301  vessel]
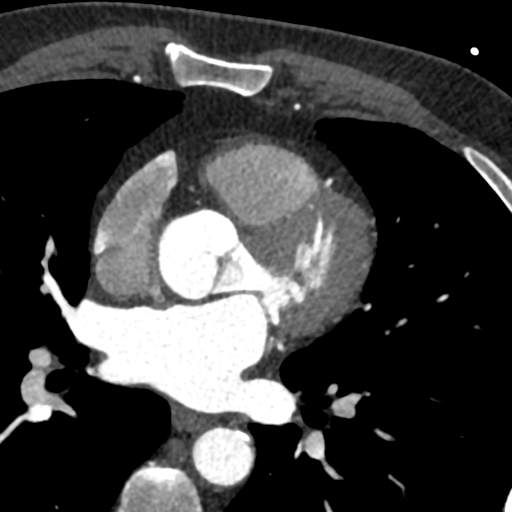

[Series 7: best syst · axial · 0.39mm/px · z∈[-170,-130]mm · 2 of 301 slices shown, 3 images]
[im 101/301  vessel]
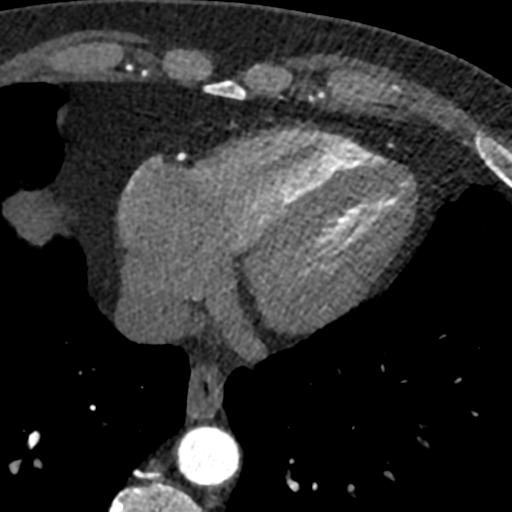
[im 101/301  lung]
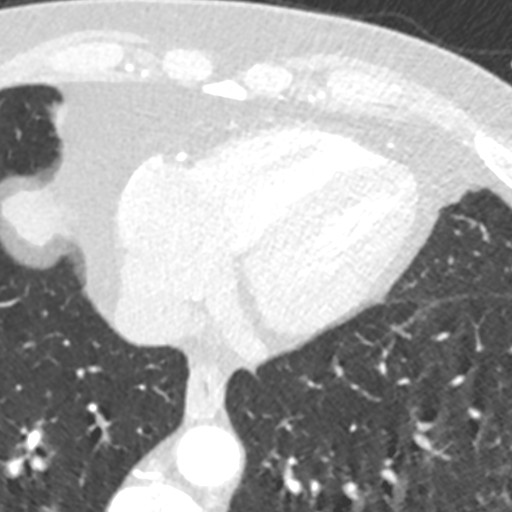
[im 201/301  vessel]
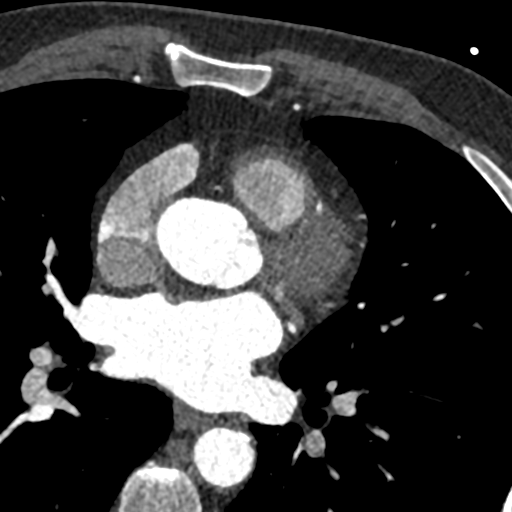

[Series 9: ts diast sharp · axial · 0.39mm/px · z∈[-170,-130]mm · 2 of 301 slices shown]
[im 101/301  lung]
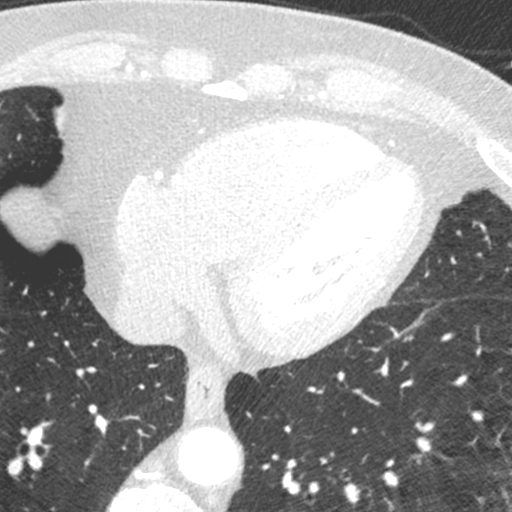
[im 201/301  lung]
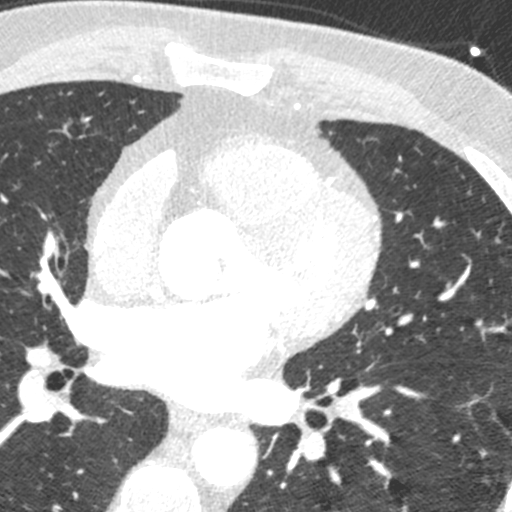

[Series 10: ts syst sharp · axial · 0.39mm/px · z∈[-170,-130]mm · 2 of 301 slices shown]
[im 101/301  lung]
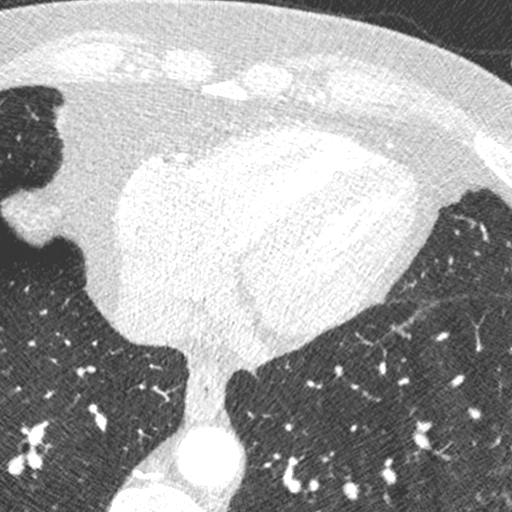
[im 201/301  lung]
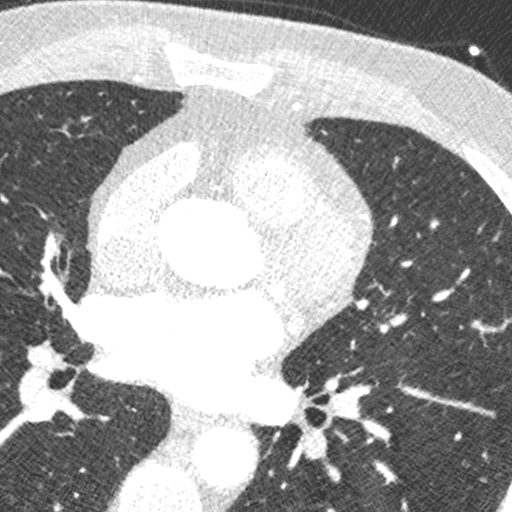

[8 of 20 positions shown; findings below may reference images not displayed]

FINDINGS: Vascular: Heart is normal size.  Aorta normal caliber.

Mediastinum/Nodes: No adenopathy.

Lungs/Pleura: No confluent opacities or effusions.

Upper Abdomen: Imaging into the upper abdomen shows no acute
findings.

Musculoskeletal: Chest wall soft tissues are unremarkable. No acute
bony abnormality.
IMPRESSION: No acute or significant extracardiac abnormality.
FINDINGS: A 120 kV prospective scan was triggered in the descending thoracic
aorta at 111 HU's. Axial non-contrast 3 mm slices were carried out
through the heart. The data set was analyzed on a dedicated work
station and scored using the Agatson method. Gantry rotation speed
was 250 msecs and collimation was .6 mm. No beta blockade and 0.8 mg
of sl NTG was given. The 3D data set was reconstructed in 5%
intervals of the 67-82 % of the R-R cycle. Diastolic phases were
analyzed on a dedicated work station using MPR, MIP and VRT modes.
The patient received 80 cc of contrast.

Aorta: Normal size.  No calcifications.  No dissection.

Aortic Valve:  Trileaflet.  No calcifications.

Coronary Arteries:  Normal coronary origin.  Right dominance.

RCA is a large dominant artery that gives rise to PDA and PLVB.
There is a mild (25-49%) calcified plaque in the proximal portion of
the vessel. There is minimal (<24%) calcified plaque in the mid
portion of the vessel. The distal RCA with no plaques.

Left main is a large artery that gives rise to LAD and LCX arteries.

LAD is a large vessel. The proximal LAD with two separate calcified
plaques: The first is a moderate (50-69%) calcified plaque. The
second is a mild (25-49%) calcified plaque. The mid portion with
minimal irregularities. The distal portion with no plaques.

LCX is a non-dominant artery that gives rise to one large OM1
branch. There is no plaque.

Other findings:

Normal pulmonary vein drainage into the left atrium.

Normal left atrial appendage without a thrombus.

Normal size of the pulmonary artery.
IMPRESSION: 1. Coronary calcium score of 312. This was 64 percentile for age and
sex matched control.

2. Normal coronary origin with right dominance.

3. Moderate Coronary Artery Disease. CADRADS 3. This study will be
sent for FFR analysis.

Migers Enea, DO

*** End of Addendum ***
EXAM:
OVER-READ INTERPRETATION  CT CHEST

The following report is an over-read performed by radiologist Dr.
Silja Nebojsa Limar Ausfluge [REDACTED] on 04/15/2020. This over-read
does not include interpretation of cardiac or coronary anatomy or
pathology. The coronary CTA interpretation by the cardiologist is
attached.
FINDINGS: Vascular: Heart is normal size.  Aorta normal caliber.

Mediastinum/Nodes: No adenopathy.

Lungs/Pleura: No confluent opacities or effusions.

Upper Abdomen: Imaging into the upper abdomen shows no acute
findings.

Musculoskeletal: Chest wall soft tissues are unremarkable. No acute
bony abnormality.
IMPRESSION: No acute or significant extracardiac abnormality.

## 2020-04-15 MED ORDER — NITROGLYCERIN 0.4 MG SL SUBL
SUBLINGUAL_TABLET | SUBLINGUAL | Status: AC
  Filled 2020-04-15: qty 2

## 2020-04-15 MED ORDER — NITROGLYCERIN 0.4 MG SL SUBL
0.8000 mg | SUBLINGUAL_TABLET | Freq: Once | SUBLINGUAL | Status: AC
  Administered 2020-04-15: 0.8 mg via SUBLINGUAL

## 2020-04-15 MED ORDER — IOHEXOL 350 MG/ML SOLN
80.0000 mL | Freq: Once | INTRAVENOUS | Status: AC | PRN
  Administered 2020-04-15: 80 mL via INTRAVENOUS

## 2020-04-19 DIAGNOSIS — I2510 Atherosclerotic heart disease of native coronary artery without angina pectoris: Secondary | ICD-10-CM | POA: Diagnosis not present

## 2020-05-24 ENCOUNTER — Telehealth: Payer: Self-pay | Admitting: Cardiovascular Disease

## 2020-05-24 NOTE — Telephone Encounter (Signed)
Left message to call back  

## 2020-05-24 NOTE — Telephone Encounter (Signed)
Pt called back returning a call from our office he got Thursday 07/15 about his CT Results. Please call back to discuss the results

## 2020-05-25 NOTE — Telephone Encounter (Signed)
Left message for patient to call back  

## 2020-05-25 NOTE — Telephone Encounter (Signed)
Patient is returning call.  °

## 2020-05-26 NOTE — Telephone Encounter (Signed)
Spoke with patient and reviewed ct results.  Pt verbalizes understanding.  Scheduled follow up with Dr. Angelena Form on 8/27 - first available date that pt is able to come.   He was apologetic for missing calls.  He had mistakenly blocked [facility] number.

## 2020-05-26 NOTE — Telephone Encounter (Signed)
Pt is returning call stating he has not received a callback nor a VM from a missed call. I verified the listed number is correct and advised Daryl I would send a message for another callback. Please advise.

## 2020-05-26 NOTE — Telephone Encounter (Signed)
Left message for patient to call back on his mobile number. Called wife Janet's number (DPR).  She answered but did not want to take message for patient.  She will let him know I am trying to reach him.

## 2020-07-02 ENCOUNTER — Ambulatory Visit: Payer: PPO | Admitting: Cardiovascular Disease

## 2020-07-02 ENCOUNTER — Other Ambulatory Visit: Payer: Self-pay

## 2020-07-02 ENCOUNTER — Encounter: Payer: Self-pay | Admitting: Cardiovascular Disease

## 2020-07-02 VITALS — BP 138/70 | HR 89 | Ht 71.0 in | Wt 202.0 lb

## 2020-07-02 DIAGNOSIS — I2510 Atherosclerotic heart disease of native coronary artery without angina pectoris: Secondary | ICD-10-CM

## 2020-07-02 NOTE — Patient Instructions (Signed)
Medication Instructions:  No changes *If you need a refill on your cardiac medications before your next appointment, please call your pharmacy*   Lab Work: none If you have labs (blood work) drawn today and your tests are completely normal, you will receive your results only by: Marland Kitchen MyChart Message (if you have MyChart) OR . A paper copy in the mail If you have any lab test that is abnormal or we need to change your treatment, we will call you to review the results.   Testing/Procedures: none   Follow-Up: At [facility], you and your health needs are our priority.  As part of our continuing mission to provide you with exceptional heart care, we have created designated Provider Care Teams.  These Care Teams include your primary Cardiologist (physician) and Advanced Practice Providers (APPs -  Physician Assistants and Nurse Practitioners) who all work together to provide you with the care you need, when you need it.   Your next appointment:   6 month(s)  The format for your next appointment:   In Person  Provider:   You may see Dr. Lauree Chandler or one of the following Advanced Practice Providers on your designated Care Team:    Melina Copa, PA-C  Ermalinda Barrios, PA-C    Other Instructions

## 2020-07-02 NOTE — Progress Notes (Signed)
Chief Complaint  Patient presents with  . Follow-up    chest pain    History of Present Illness: 69 yo male with history of mild carotid artery disease who is here today for cardiac follow up. I saw him as a new consult for the evaluation of chest pain in May 2021. EKG 03/01/20 in primary care with sinus rhythm and no ischemic changes. Carotid artery dopplers September 2019 with mild disease. He described an episode of severe chest pain with bandlike tightness across his chest one month prior to his first visit here. This did not imrpve with positional changes. This lasted for three minutes. No associated dyspnea, dizziness, diaphoresis, palpitations. Over the next few days he had some palpitations. His father had CAD and needed CABG in his early 31s. Gated cardiac CTA with moderate LAD stenosis, mild RCA stenosis. No flow limiting lesions by FFR. Echo May 2021 with LVEF=55-60%, no valve disease.   He is here today for follow up. The patient denies any chest pain, dyspnea, palpitations, lower extremity edema, orthopnea, PND, dizziness, near syncope or syncope. He has been feeling well.   Primary Care Physician: Binnie Rail, MD  Past Medical History:  Diagnosis Date  . Bursitis of elbow    left  . Nongonococcal urethritis (NGU) due to other specified organism   . Personal history of colonic adenomas 03/18/2013  . Rotator cuff injury    Left  . Tendinitis of left elbow   . Urticaria     Past Surgical History:  Procedure Laterality Date  . APPENDECTOMY  1968  . COLONOSCOPY    . OLECRANON BURSECTOMY Left 10/04/2016   Procedure: LEFT OLECRANON BURSECTOMY;  Surgeon: Leandrew Koyanagi, MD;  Location: [place];  Service: Orthopedics;  Laterality: Left;  . ROTATOR CUFF REPAIR  2008   Arthroscopic Repair ([facility])    Current Outpatient Medications  Medication Sig Dispense Refill  . Flax Oil-Fish Oil-Borage Oil (FISH OIL-FLAX OIL-BORAGE OIL) CAPS Take by mouth. Take 2 capsules  bid    . fluticasone (FLONASE) 50 MCG/ACT nasal spray Place 2 sprays into both nostrils as needed for allergies or rhinitis.    . NON FORMULARY Herbal blend for prostate-Take one capsule bid    . senna-docusate (SENOKOT S) 8.6-50 MG tablet Take 1 tablet by mouth at bedtime as needed. 30 tablet 1  . vitamin C (ASCORBIC ACID) 500 MG tablet Take 500 mg by mouth 2 (two) times daily.    . Vitamin D, Cholecalciferol, 1000 units CAPS Take by mouth.    . vitamin E 100 UNIT capsule Take by mouth daily.    Marland Kitchen ZINC PICOLINATE PO Take 50 mg by mouth.     No current facility-administered medications for this visit.    Allergies  Allergen Reactions  . Sulfa Antibiotics Nausea Only    Makes me gag!    Social History   Socioeconomic History  . Marital status: Married    Spouse name: Not on file  . Number of children: 2  . Years of education: 43  . Highest education level: Not on file  Occupational History  . 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 Smoking status: Former Smoker    Packs/day: 1.00    Years: 22.00    Pack years: 22.00    Types: Cigarettes  Quit date: 06/26/2002    Years since quitting: 18.0  . Smokeless tobacco: Never Used  Vaping Use  . Vaping Use: Never used  Substance and Sexual Activity  . Alcohol use: Yes    Alcohol/week: 10.0 standard drinks    Types: 3 Glasses of wine, 2 Cans of beer, 5 Standard drinks or equivalent per week    Comment: social - 3-4 days a week  . Drug use: No  . Sexual activity: Yes    Partners: Female  Other Topics Concern  . Not on file  Social History Narrative   HSG, 2 yrs [employer]. Married '71. 2 daughters - '74, '77; 2 grandchildren. Marriage in [place]. Retired -but working part-time for [employer]. POA wife. Full resuscitation, short-term mechanical ventilation.      Fun:  Work in the yard, fishing at [employer]   Denies religious beliefs effecting health care.          Social Determinants of Health   Financial Resource Strain:   . Difficulty of Paying Living Expenses: Not on file  Food Insecurity:   . Worried About Charity fundraiser in the Last Year: Not on file  . Ran Out of Food in the Last Year: Not on file  Transportation Needs:   . Lack of Transportation (Medical): Not on file  . Lack of Transportation (Non-Medical): Not on file  Physical Activity:   . Days of Exercise per Week: Not on file  . Minutes of Exercise per Session: Not on file  Stress:   . Feeling of Stress : Not on file  Social Connections:   . Frequency of Communication with Friends and Family: Not on file  . Frequency of Social Gatherings with Friends and Family: Not on file  . Attends Religious Services: Not on file  . Active Member of Clubs or Organizations: Not on file  . Attends Archivist Meetings: Not on file  . Marital Status: Not on file  Intimate Partner Violence:   . Fear of Current or Ex-Partner: Not on file  . Emotionally Abused: Not on file  . Physically Abused: Not on file  . Sexually Abused: Not on file    Family History  Problem Relation Age of Onset  . Hypertension Mother   . Dementia Mother   . Prostate cancer Father   . Coronary artery disease Father   . Cancer Father        lung cancer  . Heart disease Father        CAD/CABG  . Healthy Maternal Grandmother   . Testicular cancer Maternal Grandfather   . Healthy Paternal Grandmother   . Hiatal hernia Paternal Grandfather   . Diabetes Neg Hx   . Heart attack Neg Hx   . Colon cancer Neg Hx   . Hyperlipidemia Neg Hx   . Rectal cancer Neg Hx   . Stomach cancer Neg Hx     Review of Systems:  As stated in the HPI and otherwise negative.   BP 138/70   Pulse 89   Ht 5\' 11"  (1.803 m)   Wt 202 lb (91.6 kg)   SpO2 97%   BMI 28.17 kg/m   Physical Examination: General: Well  developed, well nourished, NAD  HEENT: OP clear, mucus membranes moist  SKIN: warm, dry. No rashes. Neuro: No focal deficits  Musculoskeletal: Muscle strength 5/5 all ext  Psychiatric: Mood and affect normal  Neck: No JVD, no carotid bruits, no thyromegaly, no lymphadenopathy.  Lungs:Clear bilaterally, no wheezes, rhonci, crackles Cardiovascular: Regular rate and rhythm. No murmurs, gallops or rubs. Abdomen:Soft. Bowel sounds present. Non-tender.  Extremities: No lower extremity edema. Pulses are 2 + in the bilateral DP/PT.  EKG:  EKG is not  ordered today. The ekg ordered today demonstrates   Echo 04/02/20: 1. Left ventricular ejection fraction, by estimation, is 55 to 60%. The  left ventricle has normal function. The left ventricle has no regional  wall motion abnormalities. Left ventricular diastolic parameters are  consistent with Grade I diastolic  dysfunction (impaired relaxation). The average left ventricular global  longitudinal strain is -19.0 %. The global longitudinal strain is normal.  2. Right ventricular systolic function is normal. The right ventricular  size is normal.  3. The mitral valve is normal in structure. No evidence of mitral valve  regurgitation. No evidence of mitral stenosis.  4. The aortic valve is normal in structure. Aortic valve regurgitation is  not visualized. No aortic stenosis is present.  5. The inferior vena cava is normal in size with greater than 50%  respiratory variability, suggesting right atrial pressure of 3 mmHg.   Recent Labs: 02/13/2020: ALT 27; Hemoglobin 15.2; Platelets 206.0 03/11/2020: BUN 25; Potassium 4.9; Sodium 141 04/15/2020: Creatinine, Ser 1.30   Lipid Panel    Component Value Date/Time   CHOL 196 02/13/2020 1501   TRIG 272.0 (H) 02/13/2020 1501   TRIG 80 09/26/2006 1006   HDL 39.60 02/13/2020 1501   CHOLHDL 5 02/13/2020 1501   VLDL 54.4 (H) 02/13/2020 1501   LDLCALC 108 (H) 10/19/2016 0954   LDLDIRECT 128.0  02/13/2020 1501     Wt Readings from Last 3 Encounters:  07/02/20 202 lb (91.6 kg)  03/11/20 201 lb 3.2 oz (91.3 kg)  02/13/20 200 lb (90.7 kg)     Assessment and Plan:   1. CAD without angina: Moderate CAD by cardiac CTA May 2021. LV systolic function is normal.  He has had no recurrent chest pain. Will start ASA 81 mg daily. We have discussed adding a statin but he does not wish to do so now. He wishes to work on dietary changes and repeat lipids in 6 months. I have discussed starting Crestor 10 mg daily. He will call back if he chooses to do this. We also discussed Zetia and Repatha.   Current medicines are reviewed at length with the patient today.  The patient does not have concerns regarding medicines.  The following changes have been made:  no change  Labs/ tests ordered today include:   No orders of the defined types were placed in this encounter.    Disposition:   FU with me in 6 months    Signed, Lauree Chandler, MD 07/02/2020 3:17 PM    [facility] [address], [place], [place]  [address] Phone: [phone]; Fax: [phone]

## 2020-07-12 ENCOUNTER — Other Ambulatory Visit: Payer: Self-pay

## 2020-07-12 ENCOUNTER — Encounter: Payer: Self-pay | Admitting: *Deleted

## 2020-07-12 ENCOUNTER — Ambulatory Visit
Admission: EM | Admit: 2020-07-12 | Discharge: 2020-07-12 | Disposition: A | Discharge status: Home / Self Care | Payer: PPO | Attending: Family Medicine | Admitting: Family Medicine

## 2020-07-12 DIAGNOSIS — R7303 Prediabetes: Secondary | ICD-10-CM | POA: Insufficient documentation

## 2020-07-12 DIAGNOSIS — Z79899 Other long term (current) drug therapy: Secondary | ICD-10-CM | POA: Insufficient documentation

## 2020-07-12 DIAGNOSIS — Z87891 Personal history of nicotine dependence: Secondary | ICD-10-CM | POA: Insufficient documentation

## 2020-07-12 DIAGNOSIS — R197 Diarrhea, unspecified: Secondary | ICD-10-CM | POA: Diagnosis not present

## 2020-07-12 DIAGNOSIS — E785 Hyperlipidemia, unspecified: Secondary | ICD-10-CM | POA: Diagnosis not present

## 2020-07-12 DIAGNOSIS — Z882 Allergy status to sulfonamides status: Secondary | ICD-10-CM | POA: Insufficient documentation

## 2020-07-12 DIAGNOSIS — Z8616 Personal history of COVID-19: Secondary | ICD-10-CM | POA: Diagnosis not present

## 2020-07-12 DIAGNOSIS — Z9049 Acquired absence of other specified parts of digestive tract: Secondary | ICD-10-CM | POA: Diagnosis not present

## 2020-07-12 MED ORDER — AZITHROMYCIN 500 MG PO TABS: 500.0000 mg | ORAL_TABLET | Freq: Every day | ORAL | 0 refills | Status: DC

## 2020-07-12 MED ORDER — DIPHENOXYLATE-ATROPINE 2.5-0.025 MG PO TABS: 1.0000 | ORAL_TABLET | Freq: Four times a day (QID) | ORAL | 0 refills | Status: DC | PRN

## 2020-07-12 NOTE — ED Triage Notes (Addendum)
Patient reports after eating some bad zucchini bread on Wednesday he has diarrhea since. Reports less episodes the last 2 days. No abdominal pain. No nausea/vomiting. Patient reports bloating.   COVID + 10 months ago, not vaccinated at this time.

## 2020-07-12 NOTE — ED Notes (Signed)
No answer x1

## 2020-07-12 NOTE — Discharge Instructions (Addendum)
Your stool sample labs are pending and should resolve within the next 3 to 5 days.  If there is any change in treatment you will be notified via phone or MyChart. Pick up your azithromycin take one-time dose of 3 tablets.  Take Lomotil as prescribed this will help with the loose diarrheal type stool.  If you develop any severe abdominal pain or fever go immediately to the emergency department as she would require advanced imaging to rule out any type of colonic disease or infection.

## 2020-07-12 NOTE — ED Provider Notes (Signed)
[facility]    CSN: [id number] Arrival date & time: 07/12/20  1601      History   Chief Complaint Chief Complaint  Patient presents with  . Diarrhea    HPI Richard Novak is a 69 y.o. male.   HPI  Patient presents today with concern of persistent chronic loose stools over a period of 5 days. Reports loose stools with every use of bathroom. He initially though symptoms were related to eating old zucchini bread, however that was over 5 days ago and no one else is sick in the home. No acute abdominal pain, endorses upset stomach sensation. Continue to eat and drink normally. No recent travel or antibiotics use. Afebrile on arrival today.  Past Medical History:  Diagnosis Date  . Bursitis of elbow    left  . Nongonococcal urethritis (NGU) due to other specified organism   . Personal history of colonic adenomas 03/18/2013  . Rotator cuff injury    Left  . Tendinitis of left elbow   . Urticaria     Patient Active Problem List   Diagnosis Date Noted  . Chest pain at rest 02/13/2020  . Skin abnormalities 02/13/2020  . COVID-19 09/05/2019  . Abnormal x-ray 07/25/2018  . Elevated LFTs 10/22/2016  . Prediabetes 10/22/2016  . Hyperlipidemia 10/19/2016  . Olecranon bursitis of left elbow 09/07/2016  . Personal history of colonic adenomas 03/18/2013  . ROTATOR CUFF INJURY, LEFT SHOULDER 07/16/2009  . URTICARIA, HX OF 07/16/2009    Past Surgical History:  Procedure Laterality Date  . APPENDECTOMY  1968  . COLONOSCOPY    . OLECRANON BURSECTOMY Left 10/04/2016   Procedure: LEFT OLECRANON BURSECTOMY;  Surgeon: Leandrew Koyanagi, MD;  Location: [place];  Service: Orthopedics;  Laterality: Left;  . ROTATOR CUFF REPAIR  2008   Arthroscopic Repair ([facility])       Home Medications    Prior to Admission medications   Medication Sig Start Date End Date Taking? Authorizing Provider  Flax Oil-Fish Oil-Borage Oil (FISH OIL-FLAX OIL-BORAGE OIL) CAPS Take by  mouth. Take 2 capsules bid    [provider]  fluticasone (FLONASE) 50 MCG/ACT nasal spray Place 2 sprays into both nostrils as needed for allergies or rhinitis.    [provider]  NON FORMULARY Herbal blend for prostate-Take one capsule bid    [provider]  senna-docusate (SENOKOT S) 8.6-50 MG tablet Take 1 tablet by mouth at bedtime as needed. 10/04/16   Leandrew Koyanagi, MD  vitamin C (ASCORBIC ACID) 500 MG tablet Take 500 mg by mouth 2 (two) times daily.    [provider]  Vitamin D, Cholecalciferol, 1000 units CAPS Take by mouth.    [provider]  vitamin E 100 UNIT capsule Take by mouth daily.    [provider]  ZINC PICOLINATE PO Take 50 mg by mouth.    [provider]    Family History Family History  Problem Relation Age of Onset  . Hypertension Mother   . Dementia Mother   . Prostate cancer Father   . Coronary artery disease Father   . Cancer Father        lung cancer  . Heart disease Father        CAD/CABG  . Healthy Maternal Grandmother   . Testicular cancer Maternal Grandfather   . Healthy Paternal Grandmother   . Hiatal hernia Paternal Grandfather   . Diabetes Neg Hx   . Heart attack  Neg Hx   . Colon cancer Neg Hx   . Hyperlipidemia Neg Hx   . Rectal cancer Neg Hx   . Stomach cancer Neg Hx     Social History Social History   Tobacco Use  . Smoking status: Former Smoker    Packs/day: 1.00    Years: 22.00    Pack years: 22.00    Types: Cigarettes    Quit date: 06/26/2002    Years since quitting: 18.0  . Smokeless tobacco: Never Used  Vaping Use  . Vaping Use: Never used  Substance Use Topics  . Alcohol use: Yes    Alcohol/week: 10.0 standard drinks    Types: 3 Glasses of wine, 2 Cans of beer, 5 Standard drinks or equivalent per week    Comment: social - 3-4 days a week  . Drug use: No     Allergies   Sulfa antibiotics   Review of Systems Review of Systems Pertinent negatives  listed in HPI  Physical Exam Triage Vital Signs ED Triage Vitals  Enc Vitals Group     BP 07/12/20 1320 139/81     Pulse Rate 07/12/20 1320 81     Resp 07/12/20 1320 17     Temp 07/12/20 1320 98 F (36.7 C)     Temp Source 07/12/20 1320 Oral     SpO2 07/12/20 1320 96 %     Weight --      Height --      Head Circumference --      Peak Flow --      Pain Score 07/12/20 1318 0     Pain Loc --      Pain Edu? --      Excl. in [place]? --    No data found.  Updated Vital Signs BP 139/81 (BP Location: Right Arm)   Pulse 81   Temp 98 F (36.7 C) (Oral)   Resp 17   SpO2 96%   Visual Acuity Right Eye Distance:   Left Eye Distance:   Bilateral Distance:    Right Eye Near:   Left Eye Near:    Bilateral Near:     Physical Exam  General appearance: alert, well developed, well nourished, cooperative and in no distress Head: Normocephalic, without obvious abnormality, atraumatic Respiratory: Respirations even and unlabored, normal respiratory rate Heart: rate and rhythm normal. No gallop or murmurs noted on exam  Abdomen: BS (hyperactive x 4), no distention, no rebound tenderness, or no mass Extremities: No gross deformities Skin: Skin color, texture, turgor normal. No rashes seen  Psych: Appropriate mood and affect. Neurologic:GCS 15, normal gait and symmetrical movements UC Treatments / Results  Labs (all labs ordered are listed, but only abnormal results are displayed) Labs Reviewed - No data to display  EKG   Radiology No results found.  Procedures Procedures (including critical care time)  Medications Ordered in UC Medications - No data to display  Initial Impression / Assessment and Plan / UC Course  I have reviewed the triage vital signs and the nursing notes.  Pertinent labs & imaging results that were available during my care of the patient were reviewed by me and considered in my medical decision making (see chart for details).    Treating for presumed  infectious diarrhea with Azithromycin 1500 mg x one dose and lomotil PRN for loose stool. If no improvement, follow-up with PCP as you may require advanced imaging which is not available in urgent care setting. See detailed discharge instructions.  ER if abdominal pain develops and or fever. An After Visit Summary was printed and given to the patient.  Final Clinical Impressions(s) / UC Diagnoses   Final diagnoses:  Diarrhea, unspecified type  Diarrhea of presumed infectious origin     Discharge Instructions     Your stool sample labs are pending and should resolve within the next 3 to 5 days.  If there is any change in treatment you will be notified via phone or MyChart. Pick up your azithromycin take one-time dose of 3 tablets.  Take Lomotil as prescribed this will help with the loose diarrheal type stool.  If you develop any severe abdominal pain or fever go immediately to the emergency department as she would require advanced imaging to rule out any type of colonic disease or infection.    ED Prescriptions    Medication Sig Dispense Auth. Provider   diphenoxylate-atropine (LOMOTIL) 2.5-0.025 MG tablet Take 1-2 tablets by mouth 4 (four) times daily as needed for diarrhea or loose stools. 30 tablet Scot Jun, FNP   azithromycin (ZITHROMAX) 500 MG tablet Take 1 tablet (500 mg total) by mouth daily. 3 tablet Scot Jun, FNP     PDMP not reviewed this encounter.   Scot Jun, FNP 07/17/20 [phone]

## 2020-07-14 LAB — GASTROINTESTINAL PANEL BY PCR, STOOL (REPLACES STOOL CULTURE)

## 2020-07-15 ENCOUNTER — Telehealth: Payer: Self-pay | Admitting: Internal Medicine

## 2020-07-15 NOTE — Telephone Encounter (Signed)
Made appt w/Laura for tomorrow.Marland KitchenJohny Chess

## 2020-07-15 NOTE — Telephone Encounter (Signed)
Patient called and said that he went to urgent care on 07/12/2020 for diarrhea and there they prescribed him  azithromycin (ZITHROMAX) 500 MG tablet  And he is out. He said that this has been going on for a week now. Patient was wondering what the next steps should be.

## 2020-07-15 NOTE — Telephone Encounter (Signed)
Schedule virtual

## 2020-07-16 ENCOUNTER — Other Ambulatory Visit (INDEPENDENT_AMBULATORY_CARE_PROVIDER_SITE_OTHER): Payer: PPO

## 2020-07-16 ENCOUNTER — Telehealth (INDEPENDENT_AMBULATORY_CARE_PROVIDER_SITE_OTHER): Payer: PPO | Admitting: Family

## 2020-07-16 DIAGNOSIS — R197 Diarrhea, unspecified: Secondary | ICD-10-CM | POA: Diagnosis not present

## 2020-07-16 LAB — CBC WITH DIFFERENTIAL/PLATELET
Basophils Absolute: 0.1 10*3/uL (ref 0.0–0.1)
Basophils Relative: 1.2 % (ref 0.0–3.0)
Eosinophils Absolute: 0.1 10*3/uL (ref 0.0–0.7)
Eosinophils Relative: 1.7 % (ref 0.0–5.0)
HCT: 45 % (ref 39.0–52.0)
Hemoglobin: 15.3 g/dL (ref 13.0–17.0)
Lymphocytes Relative: 25.6 % (ref 12.0–46.0)
Lymphs Abs: 1.9 10*3/uL (ref 0.7–4.0)
MCHC: 33.9 g/dL (ref 30.0–36.0)
MCV: 94.6 fl (ref 78.0–100.0)
Monocytes Absolute: 0.5 10*3/uL (ref 0.1–1.0)
Monocytes Relative: 7.2 % (ref 3.0–12.0)
Neutro Abs: 4.9 10*3/uL (ref 1.4–7.7)
Neutrophils Relative %: 64.3 % (ref 43.0–77.0)
Platelets: 190 10*3/uL (ref 150.0–400.0)
RBC: 4.76 Mil/uL (ref 4.22–5.81)
RDW: 13.3 % (ref 11.5–15.5)
WBC: 7.6 10*3/uL (ref 4.0–10.5)

## 2020-07-16 LAB — COMPREHENSIVE METABOLIC PANEL
ALT: 24 U/L (ref 0–53)
AST: 21 U/L (ref 0–37)
Albumin: 4.4 g/dL (ref 3.5–5.2)
Alkaline Phosphatase: 54 U/L (ref 39–117)
BUN: 15 mg/dL (ref 6–23)
CO2: 29 mEq/L (ref 19–32)
Calcium: 9.5 mg/dL (ref 8.4–10.5)
Chloride: 102 mEq/L (ref 96–112)
Creatinine, Ser: 1.18 mg/dL (ref 0.40–1.50)
GFR: 60.88 mL/min (ref >60.00)
Glucose, Bld: 101 mg/dL — ABNORMAL HIGH (ref 70–99)
Potassium: 4.7 mEq/L (ref 3.5–5.1)
Sodium: 136 mEq/L (ref 135–145)
Total Bilirubin: 0.7 mg/dL (ref 0.2–1.2)
Total Protein: 6.7 g/dL (ref 6.0–8.3)

## 2020-07-16 LAB — LIPASE: Lipase: 15 U/L (ref 11.0–59.0)

## 2020-07-16 LAB — AMYLASE: Amylase: 33 U/L (ref 27–131)

## 2020-07-16 NOTE — Addendum Note (Signed)
Addended by: Jacob Moores on: 07/16/2020 12:25 PM   Modules accepted: Orders

## 2020-07-16 NOTE — Progress Notes (Signed)
Richard Novak is a 69 y.o. male with the following history as recorded in [employer]:  Patient Active Problem List   Diagnosis Date Noted  . Chest pain at rest 02/13/2020  . Skin abnormalities 02/13/2020  . COVID-19 09/05/2019  . Abnormal x-ray 07/25/2018  . Elevated LFTs 10/22/2016  . Prediabetes 10/22/2016  . Hyperlipidemia 10/19/2016  . Olecranon bursitis of left elbow 09/07/2016  . Personal history of colonic adenomas 03/18/2013  . ROTATOR CUFF INJURY, LEFT SHOULDER 07/16/2009  . URTICARIA, HX OF 07/16/2009    Current Outpatient Medications  Medication Sig Dispense Refill  . azithromycin (ZITHROMAX) 500 MG tablet Take 1 tablet (500 mg total) by mouth daily. 3 tablet 0  . diphenoxylate-atropine (LOMOTIL) 2.5-0.025 MG tablet Take 1-2 tablets by mouth 4 (four) times daily as needed for diarrhea or loose stools. 30 tablet 0  . Flax Oil-Fish Oil-Borage Oil (FISH OIL-FLAX OIL-BORAGE OIL) CAPS Take by mouth. Take 2 capsules bid    . fluticasone (FLONASE) 50 MCG/ACT nasal spray Place 2 sprays into both nostrils as needed for allergies or rhinitis.    . NON FORMULARY Herbal blend for prostate-Take one capsule bid    . senna-docusate (SENOKOT S) 8.6-50 MG tablet Take 1 tablet by mouth at bedtime as needed. 30 tablet 1  . vitamin C (ASCORBIC ACID) 500 MG tablet Take 500 mg by mouth 2 (two) times daily.    . Vitamin D, Cholecalciferol, 1000 units CAPS Take by mouth.    . vitamin E 100 UNIT capsule Take by mouth daily.    Marland Kitchen ZINC PICOLINATE PO Take 50 mg by mouth.     No current facility-administered medications for this visit.    Allergies: Sulfa antibiotics  Past Medical History:  Diagnosis Date  . Bursitis of elbow    left  . Nongonococcal urethritis (NGU) due to other specified organism   . Personal history of colonic adenomas 03/18/2013  . Rotator cuff injury    Left  . Tendinitis of left elbow   . Urticaria     Past Surgical History:  Procedure Laterality Date  . APPENDECTOMY   1968  . COLONOSCOPY    . OLECRANON BURSECTOMY Left 10/04/2016   Procedure: LEFT OLECRANON BURSECTOMY;  Surgeon: Leandrew Koyanagi, MD;  Location: [place];  Service: Orthopedics;  Laterality: Left;  . ROTATOR CUFF REPAIR  2008   Arthroscopic Repair ([facility])    Family History  Problem Relation Age of Onset  . Hypertension Mother   . Dementia Mother   . Prostate cancer Father   . Coronary artery disease Father   . Cancer Father        lung cancer  . Heart disease Father        CAD/CABG  . Healthy Maternal Grandmother   . Testicular cancer Maternal Grandfather   . Healthy Paternal Grandmother   . Hiatal hernia Paternal Grandfather   . Diabetes Neg Hx   . Heart attack Neg Hx   . Colon cancer Neg Hx   . Hyperlipidemia Neg Hx   . Rectal cancer Neg Hx   . Stomach cancer Neg Hx     Social History   Tobacco Use  . Smoking status: Former Smoker    Packs/day: 1.00    Years: 22.00    Pack years: 22.00    Types: Cigarettes    Quit date: 06/26/2002    Years since quitting: 18.0  . Smokeless tobacco: Never Used  Substance Use Topics  . Alcohol  use: Yes    Alcohol/week: 10.0 standard drinks    Types: 3 Glasses of wine, 2 Cans of beer, 5 Standard drinks or equivalent per week    Comment: social - 3-4 days a week    Subjective:   I connected with Richard Novak on 07/16/20 at 10:00 AM EDT by a video enabled telemedicine application and verified that I am speaking with the correct person using two identifiers.   I discussed the limitations of evaluation and management by telemedicine and the availability of in person appointments. The patient expressed understanding and agreed to proceed.   1 week history of acute diarrhea; feels that he ate "bad" zucchini bread last Thursday and this caused symptoms; went to [facility] on Monday and was treated with Zithromax and had normal GI pathogen panel; notes that symptoms have persisted- no blood in stool/ no abdominal pain; has not adjusted  his diet to try and eat milder foods this week- "eating normally" and drinking lots of water;    Objective:  There were no vitals filed for this visit.  General: Well developed, well nourished, in no acute distress  Head: Normocephalic and atraumatic  Lungs: Respirations unlabored;  Neurologic: Alert and oriented; speech intact; face symmetrical; moves all extremities well; CNII-XII intact without focal deficit   Assessment:  1. Acute diarrhea     Plan:  Already treated with Zithromax and Lomotil with no improvement; GI pathogen panel is normal; check CBC, CMP, amylase and lipase today; discussed need for bland foods/ Gatorade/ Ginger Ale; follow up to be determined on lab results;  No follow-ups on file.  Orders Placed This Encounter  Procedures  . Amylase    Standing Status:   Future    Standing Expiration Date:   07/16/2021  . Lipase    Standing Status:   Future    Standing Expiration Date:   07/16/2021  . CBC with Differential/Platelet    Standing Status:   Future    Standing Expiration Date:   07/16/2021  . Comp Met (CMET)    Standing Status:   Future    Standing Expiration Date:   07/16/2021    Requested Prescriptions    No prescriptions requested or ordered in this encounter

## 2020-08-23 ENCOUNTER — Encounter: Payer: Self-pay | Admitting: Internal Medicine

## 2020-08-23 ENCOUNTER — Ambulatory Visit: Payer: PPO | Admitting: Internal Medicine

## 2020-08-23 VITALS — BP 144/86 | HR 68 | Ht 71.0 in | Wt 201.0 lb

## 2020-08-23 DIAGNOSIS — R197 Diarrhea, unspecified: Secondary | ICD-10-CM

## 2020-08-23 MED ORDER — DIPHENOXYLATE-ATROPINE 2.5-0.025 MG PO TABS: 1.0000 | ORAL_TABLET | Freq: Four times a day (QID) | ORAL | 0 refills | Status: DC | PRN

## 2020-08-23 NOTE — Progress Notes (Signed)
Richard Novak 69 y.o. October 30, 1951 [id number]  Assessment & Plan:   Encounter Diagnosis  Name Primary?  . Diarrhea, unspecified type Yes   This is a puzzling story to me.  I'm having a hard time connecting bed zucchini bread to diarrhea but it was temporally associated.  He has had a transient response to antibiotic treatment.  Previous testing was negative.  He could need a colonoscopy but I would not go there now.  Work-up with a calprotectin and repeat GI pathogen panel.  Further plans pending that.  Treat with Lomotil since that helped before.   Orders Placed This Encounter  Procedures  . Calprotectin, Fecal  . GI Profile, Stool, PCR    Meds ordered this encounter  Medications  . diphenoxylate-atropine (LOMOTIL) 2.5-0.025 MG tablet    Sig: Take 1 tablet by mouth 4 (four) times daily as needed for diarrhea or loose stools.    Dispense:  60 tablet    Refill:  0      Subjective:   Chief Complaint: Diarrhea  HPI The patient has had diarrhea that started after eating what he describes as "bad zucchini bread".  His neighbor gave him some and it just didn't taste right and right after that he developed explosive diarrhea for about 3 weeks.  Timeline is not entirely consistent in this man but he was seen in the emergency room or urgent care on 07/12/2020 a GI pathogen panel ended up being negative and he was treated empirically with azithromycin before those results came back.  Also prescribed Lomotil. He did a follow-up with Jodi Mourning, FNP and had CBC CMET and lipase that were okay.  September 10.  There was a period of time where he was somewhat better and then the diarrhea recurred and is ongoing.  He thought drinking tonic water seem to help.  His wife takes hydroxychloroquine for autoimmune disease and he for some reason decided to take that to see if it would help but it really did not relieve things.   His symptom complex is that of a lot of borborygmi and numerous very  loose to watery stools.  Occasionally formed mostly watery.  No bleeding.  Medications have been stable.  Stools do not tend to wake him up.  Ingestion of food is related to defecation.  Wt Readings from Last 3 Encounters:  08/23/20 201 lb (91.2 kg)  07/02/20 202 lb (91.6 kg)  03/11/20 201 lb 3.2 oz (91.3 kg)   Surveillance colonoscopy due to history of polyps August 2019 no polyps diverticulosis seen.  Wt Readings from Last 3 Encounters:  08/23/20 201 lb (91.2 kg)  07/02/20 202 lb (91.6 kg)  03/11/20 201 lb 3.2 oz (91.3 kg)    Allergies  Allergen Reactions  . Sulfa Antibiotics Nausea Only    Makes me gag!   Current Meds  Medication Sig  . Flax Oil-Fish Oil-Borage Oil (FISH OIL-FLAX OIL-BORAGE OIL) CAPS Take 2 capsules by mouth 2 (two) times daily.   . fluticasone (FLONASE) 50 MCG/ACT nasal spray Place 2 sprays into both nostrils as needed for allergies or rhinitis.  . NON FORMULARY Take 1 capsule by mouth 2 (two) times daily. Herbal blend for prostate  . vitamin C (ASCORBIC ACID) 500 MG tablet Take 500 mg by mouth 2 (two) times daily.  . Vitamin D, Cholecalciferol, 1000 units CAPS Take 1,000 Units by mouth daily.   . vitamin E 100 UNIT capsule Take 100 Units by mouth 2 (two) times daily.   Marland Kitchen  ZINC PICOLINATE PO Take 50 mg by mouth daily.    Past Medical History:  Diagnosis Date  . Bursitis of elbow    left  . Nongonococcal urethritis (NGU) due to other specified organism   . Personal history of colonic adenomas 03/18/2013  . Rotator cuff injury    Left  . Tendinitis of left elbow   . Urticaria    Past Surgical History:  Procedure Laterality Date  . APPENDECTOMY  1968  . COLONOSCOPY    . OLECRANON BURSECTOMY Left 10/04/2016   Procedure: LEFT OLECRANON BURSECTOMY;  Surgeon: Leandrew Koyanagi, MD;  Location: [place];  Service: Orthopedics;  Laterality: Left;  . ROTATOR CUFF REPAIR  2008   Arthroscopic Repair [facility])   Social History   Social History  Narrative   HSG, 2 yrs [employer]. Married '71. 2 daughters - '74, '77; 2 grandchildren. Marriage in [place]. Retired -but working part-time for [employer]. POA wife. Full resuscitation, short-term mechanical ventilation.      Fun: Work in the yard, fishing at [employer]   Denies religious beliefs effecting health care.          family history includes Cancer in his father; Coronary artery disease in his father; Dementia in his mother; Healthy in his maternal grandmother and paternal grandmother; Heart disease in his father; Hiatal hernia in his paternal grandfather; Hypertension in his mother; Prostate cancer in his father; Testicular cancer in his maternal grandfather.   Review of Systems As per HPI  Objective:   Physical Exam BP (!) 144/86   Pulse 68   Ht 5\' 11"  (1.803 m)   Wt 201 lb (91.2 kg)   SpO2 99%   BMI 28.03 kg/m  No acute distress elderly white man Abdomen is soft nontender

## 2020-08-23 NOTE — Patient Instructions (Signed)
Your provider has requested that you go to the basement level for lab work before leaving today. Press "B" on the elevator. The lab is located at the first door on the left as you exit the elevator.  We will call you with results and plans.  We have sent the following medications to your pharmacy for you to pick up at your convenience: Lomotil  I appreciate the opportunity to care for you. Silvano Rusk, MD, [facility]

## 2020-08-24 ENCOUNTER — Other Ambulatory Visit: Payer: PPO

## 2020-08-24 DIAGNOSIS — R197 Diarrhea, unspecified: Secondary | ICD-10-CM

## 2020-08-27 LAB — GI PROFILE, STOOL, PCR

## 2020-08-27 LAB — CALPROTECTIN, FECAL: Calprotectin, Fecal: <16 ug/g (ref 0–120)

## 2020-11-26 ENCOUNTER — Telehealth: Payer: Self-pay | Admitting: Cardiovascular Disease

## 2020-11-26 DIAGNOSIS — I2510 Atherosclerotic heart disease of native coronary artery without angina pectoris: Secondary | ICD-10-CM

## 2020-11-26 DIAGNOSIS — E782 Mixed hyperlipidemia: Secondary | ICD-10-CM

## 2020-11-26 NOTE — Telephone Encounter (Signed)
Pt has been scheduled for lipids 1 week prior to next visit with Dr. Angelena Form.

## 2020-11-26 NOTE — Telephone Encounter (Signed)
   Per [employer] message   I think I worded my message poorly. My inquiry was for someone to ask the Doctor if I needed blood work before my appointment March 31 since one of the areas of concern from my last visit was managing my cholesterol. Sorry for the misunderstanding. Richard Novak

## 2021-01-27 ENCOUNTER — Other Ambulatory Visit: Payer: PPO

## 2021-01-27 ENCOUNTER — Other Ambulatory Visit: Payer: Self-pay

## 2021-01-27 DIAGNOSIS — E782 Mixed hyperlipidemia: Secondary | ICD-10-CM | POA: Diagnosis not present

## 2021-01-27 DIAGNOSIS — I2510 Atherosclerotic heart disease of native coronary artery without angina pectoris: Secondary | ICD-10-CM

## 2021-01-27 LAB — LIPID PANEL
Chol/HDL Ratio: 3.9 ratio (ref 0.0–5.0)
Cholesterol, Total: 177 mg/dL (ref 100–199)
HDL: 45 mg/dL (ref >39)
LDL Chol Calc (NIH): 117 mg/dL — ABNORMAL HIGH (ref 0–99)
Triglycerides: 83 mg/dL (ref 0–149)
VLDL Cholesterol Cal: 15 mg/dL (ref 5–40)

## 2021-02-02 NOTE — Progress Notes (Signed)
Chief Complaint  Patient presents with  . Follow-up    CAD    History of Present Illness: 70 yo male with history of CAD, mild carotid artery disease who is here today for cardiac follow up. I saw him as a new consult for the evaluation of chest pain in May 2021. EKG 03/01/20 in primary care with sinus rhythm and no ischemic changes. Carotid artery dopplers September 2019 with mild disease. He described an episode of severe chest pain with bandlike tightness across his chest one month prior to his first visit here. This did not improve with positional changes. This lasted for three minutes. No associated dyspnea, dizziness, diaphoresis, palpitations. Over the next few days he had some palpitations. His father had CAD and needed CABG in his early 73s. Gated cardiac CTA with moderate LAD stenosis, mild RCA stenosis. No flow limiting lesions by FFR. Echo May 2021 with LVEF=55-60%, no valve disease.   He is here today for follow up. The patient denies any chest pain, dyspnea, palpitations, lower extremity edema, orthopnea, PND, dizziness, near syncope or syncope.   Primary Care Physician: Binnie Rail, MD  Past Medical History:  Diagnosis Date  . Bursitis of elbow    left  . Nongonococcal urethritis (NGU) due to other specified organism   . Personal history of colonic adenomas 03/18/2013  . Rotator cuff injury    Left  . Tendinitis of left elbow   . Urticaria     Past Surgical History:  Procedure Laterality Date  . APPENDECTOMY  1968  . COLONOSCOPY    . OLECRANON BURSECTOMY Left 10/04/2016   Procedure: LEFT OLECRANON BURSECTOMY;  Surgeon: Leandrew Koyanagi, MD;  Location: [facility];  Service: Orthopedics;  Laterality: Left;  . ROTATOR CUFF REPAIR  2008   Arthroscopic Repair ([facility])    Current Outpatient Medications  Medication Sig Dispense Refill  . aspirin EC 81 MG tablet Take 81 mg by mouth daily. Swallow whole.    . diphenoxylate-atropine (LOMOTIL) 2.5-0.025 MG  tablet Take 1 tablet by mouth 4 (four) times daily as needed for diarrhea or loose stools. 60 tablet 0  . ezetimibe (ZETIA) 10 MG tablet Take 1 tablet (10 mg total) by mouth daily. 90 tablet 3  . Flax Oil-Fish Oil-Borage Oil (FISH OIL-FLAX OIL-BORAGE OIL) CAPS Take 2 capsules by mouth 2 (two) times daily.     . fluticasone (FLONASE) 50 MCG/ACT nasal spray Place 2 sprays into both nostrils as needed for allergies or rhinitis.    . NON FORMULARY Take 1 capsule by mouth 2 (two) times daily. Herbal blend for prostate    . vitamin C (ASCORBIC ACID) 500 MG tablet Take 500 mg by mouth 2 (two) times daily.    . Vitamin D, Cholecalciferol, 1000 units CAPS Take 1,000 Units by mouth daily.     . vitamin E 100 UNIT capsule Take 100 Units by mouth 2 (two) times daily.     Marland Kitchen ZINC PICOLINATE PO Take 50 mg by mouth daily.      No current facility-administered medications for this visit.    Allergies  Allergen Reactions  . Sulfa Antibiotics Nausea Only    Makes me gag!    Social History   Socioeconomic History  . Marital status: Married    Spouse name: Not on file  . Number of children: 2  . Years of education: 23  . Highest education level: Not on file  Occupational History  . Occupation: Agricultural engineer -  digital imaging equip [employer]    Comment: Retired in March 2011    Employer: [employer]  . 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 Smoking status: Former Smoker    Packs/day: 1.00    Years: 22.00    Pack years: 22.00    Types: Cigarettes    Quit date: 06/26/2002    Years since quitting: 18.6  . Smokeless tobacco: Never Used  Vaping Use  . Vaping Use: Never used  Substance and Sexual Activity  . Alcohol use: Yes    Comment: rare  . Drug use: No  . Sexual activity: Yes    Partners: Female  Other Topics Concern  . Not on file  Social History Narrative   HSG, 2 yrs [employer]. Married '71. 2 daughters - '74, '77; 2 grandchildren. Marriage in [place]. Retired  -but working part-time for [employer]. POA wife. Full resuscitation, short-term mechanical ventilation.      Fun: Work in the yard, fishing at [employer]   Denies religious beliefs effecting health care.          Social Determinants of Health   Financial Resource Strain: Not on file  Food Insecurity: Not on file  Transportation Needs: Not on file  Physical Activity: Not on file  Stress: Not on file  Social Connections: Not on file  Intimate Partner Violence: Not on file    Family History  Problem Relation Age of Onset  . Hypertension Mother   . Dementia Mother   . Prostate cancer Father   . Coronary artery disease Father   . Cancer Father        lung cancer  . Heart disease Father        CAD/CABG  . Healthy Maternal Grandmother   . Testicular cancer Maternal Grandfather   . Healthy Paternal Grandmother   . Hiatal hernia Paternal Grandfather   . Diabetes Neg Hx   . Heart attack Neg Hx   . Colon cancer Neg Hx   . Hyperlipidemia Neg Hx   . Rectal cancer Neg Hx   . Stomach cancer Neg Hx     Review of Systems:  As stated in the HPI and otherwise negative.   BP 130/64   Pulse 76   Ht 5\' 11"  (1.803 m)   Wt 198 lb (89.8 kg)   SpO2 98%   BMI 27.62 kg/m   Physical Examination:  General: Well developed, well nourished, NAD  HEENT: OP clear, mucus membranes moist  SKIN: warm, dry. No rashes. Neuro: No focal deficits  Musculoskeletal: Muscle strength 5/5 all ext  Psychiatric: Mood and affect normal  Neck: No JVD, no carotid bruits, no thyromegaly, no lymphadenopathy.  Lungs:Clear bilaterally, no wheezes, rhonci, crackles Cardiovascular: Regular rate and rhythm. No murmurs, gallops or rubs. Abdomen:Soft. Bowel sounds present. Non-tender.  Extremities: No lower extremity edema. Pulses are 2 + in the bilateral DP/PT.  EKG:  EKG is not  ordered today. The ekg ordered today demonstrates   Echo 04/02/20: 1. Left ventricular ejection fraction, by estimation,  is 55 to 60%. The  left ventricle has normal function. The left ventricle has no regional  wall motion abnormalities. Left ventricular diastolic parameters are  consistent with Grade I diastolic  dysfunction (impaired relaxation). The average left ventricular global  longitudinal strain is -19.0 %. The global longitudinal strain is normal.  2. Right ventricular systolic function is normal. The right ventricular  size is normal.  3. The mitral valve is  normal in structure. No evidence of mitral valve  regurgitation. No evidence of mitral stenosis.  4. The aortic valve is normal in structure. Aortic valve regurgitation is  not visualized. No aortic stenosis is present.  5. The inferior vena cava is normal in size with greater than 50%  respiratory variability, suggesting right atrial pressure of 3 mmHg.   Recent Labs: 07/16/2020: ALT 24; BUN 15; Creatinine, Ser 1.18; Hemoglobin 15.3; Platelets 190.0; Potassium 4.7; Sodium 136   Lipid Panel    Component Value Date/Time   CHOL 177 01/27/2021 0744   TRIG 83 01/27/2021 0744   TRIG 80 09/26/2006 1006   HDL 45 01/27/2021 0744   CHOLHDL 3.9 01/27/2021 0744   CHOLHDL 5 02/13/2020 1501   VLDL 54.4 (H) 02/13/2020 1501   LDLCALC 117 (H) 01/27/2021 0744   LDLDIRECT 128.0 02/13/2020 1501     Wt Readings from Last 3 Encounters:  02/03/21 198 lb (89.8 kg)  08/23/20 201 lb (91.2 kg)  07/02/20 202 lb (91.6 kg)     Assessment and Plan:   1. CAD without angina: Moderate CAD by cardiac CTA May 2021. LV systolic function is normal.  No chest pain. We have discussed adding a statin at the last visit but he wanted to work on his diet and repeat the lipids. LDL 117 March 2022. I have suggested that we start a statin. He refuses t o consider a statin. He does agree to Zetia 10 mg once daily. Repeat lipids in 6 months. Continue ASA 81 mg daily.   2. Hyperlipidemia: LDL not at goal. See above.   Current medicines are reviewed at length with the  patient today.  The patient does not have concerns regarding medicines.  The following changes have been made:  no change  Labs/ tests ordered today include:   Orders Placed This Encounter  Procedures  . Lipid panel     Disposition:   F/U with me in 12 months    Signed, Lauree Chandler, MD 02/03/2021 10:21 AM    [facility] [place], [place], [place]  [address] Phone: [phone]; Fax: [phone]

## 2021-02-03 ENCOUNTER — Ambulatory Visit: Payer: PPO | Admitting: Cardiovascular Disease

## 2021-02-03 ENCOUNTER — Encounter: Payer: Self-pay | Admitting: Cardiovascular Disease

## 2021-02-03 ENCOUNTER — Other Ambulatory Visit: Payer: Self-pay

## 2021-02-03 VITALS — BP 130/64 | HR 76 | Ht 71.0 in | Wt 198.0 lb

## 2021-02-03 DIAGNOSIS — E782 Mixed hyperlipidemia: Secondary | ICD-10-CM

## 2021-02-03 DIAGNOSIS — I2510 Atherosclerotic heart disease of native coronary artery without angina pectoris: Secondary | ICD-10-CM

## 2021-02-03 MED ORDER — EZETIMIBE 10 MG PO TABS
10.0000 mg | ORAL_TABLET | Freq: Every day | ORAL | 3 refills | Status: DC
Start: 2021-02-03 — End: 2021-06-01

## 2021-02-03 NOTE — Patient Instructions (Signed)
Medication Instructions:  Your physician has recommended you make the following change in your medication:  1.) start ezetimibe (Zetia) 10 mg - one tablet daily  *If you need a refill on your cardiac medications before your next appointment, please call your pharmacy*   Lab Work: In 6 months - LIPIDS  Testing/Procedures: NONE  Follow-Up: At [facility], you and your health needs are our priority.  As part of our continuing mission to provide you with exceptional heart care, we have created designated Provider Care Teams.  These Care Teams include your primary Cardiologist (physician) and Advanced Practice Providers (APPs -  Physician Assistants and Nurse Practitioners) who all work together to provide you with the care you need, when you need it.    Your next appointment:   12 month(s)  The format for your next appointment:   In Person  Provider:   You may see Lauree Chandler, MD or one of the following Advanced Practice Providers on your designated Care Team:    Melina Copa, PA-C  Ermalinda Barrios, PA-C

## 2021-02-09 ENCOUNTER — Encounter: Payer: Self-pay | Admitting: Internal Medicine

## 2021-02-09 DIAGNOSIS — I2510 Atherosclerotic heart disease of native coronary artery without angina pectoris: Secondary | ICD-10-CM | POA: Insufficient documentation

## 2021-02-09 NOTE — Progress Notes (Signed)
Subjective:    Patient ID: Richard Novak, male    DOB: [birth year], 70 y.o.   MRN: [id number]  HPI He is here for a physical exam.   He was just started on zetia.    Two bouts of severe diarrhea - it was explosive. First was labor day - just before thanksgiving.  He tried lomotil, stools studies were neg.  No cause was found.  He did see GI.  His wife has lupus and is currently taking hydroxychloroquine.  He was told by someone in urgent care that most likely his colon was inflamed so he took one week regimen of hydroxychlorquine 200 mg bid and it went away.  It occurred again 9 weeks ago - he cut out all supplements and did a bland diet and there is no improvement- he did the same regimen and it went away.     Medications and allergies reviewed with patient and updated if appropriate.  Patient Active Problem List   Diagnosis Date Noted  . CAD (coronary artery disease) 02/09/2021  . Chest pain at rest 02/13/2020  . Skin abnormalities 02/13/2020  . COVID-19 09/05/2019  . Abnormal x-ray 07/25/2018  . Elevated LFTs 10/22/2016  . Prediabetes 10/22/2016  . Hyperlipidemia 10/19/2016  . Olecranon bursitis of left elbow 09/07/2016  . Personal history of colonic adenomas 03/18/2013  . ROTATOR CUFF INJURY, LEFT SHOULDER 07/16/2009  . URTICARIA, HX OF 07/16/2009    Current Outpatient Medications on File Prior to Visit  Medication Sig Dispense Refill  . aspirin EC 81 MG tablet Take 81 mg by mouth daily. Swallow whole.    . ezetimibe (ZETIA) 10 MG tablet Take 1 tablet (10 mg total) by mouth daily. 90 tablet 3  . Flax Oil-Fish Oil-Borage Oil (FISH OIL-FLAX OIL-BORAGE OIL) CAPS Take 2 capsules by mouth 2 (two) times daily.     . fluticasone (FLONASE) 50 MCG/ACT nasal spray Place 2 sprays into both nostrils as needed for allergies or rhinitis.    . NON FORMULARY Take 1 capsule by mouth 2 (two) times daily. Herbal blend for prostate    . vitamin C (ASCORBIC ACID) 500 MG tablet Take 500 mg by  mouth 2 (two) times daily.    . Vitamin D, Cholecalciferol, 1000 units CAPS Take 1,000 Units by mouth daily.     . vitamin E 100 UNIT capsule Take 100 Units by mouth 2 (two) times daily.     Marland Kitchen ZINC PICOLINATE PO Take 50 mg by mouth daily.      No current facility-administered medications on file prior to visit.    Past Medical History:  Diagnosis Date  . Bursitis of elbow    left  . Nongonococcal urethritis (NGU) due to other specified organism   . Personal history of colonic adenomas 03/18/2013  . Rotator cuff injury    Left  . Tendinitis of left elbow   . Urticaria     Past Surgical History:  Procedure Laterality Date  . APPENDECTOMY  1968  . COLONOSCOPY    . OLECRANON BURSECTOMY Left 10/04/2016   Procedure: LEFT OLECRANON BURSECTOMY;  Surgeon: Leandrew Koyanagi, MD;  Location: [facility];  Service: Orthopedics;  Laterality: Left;  . ROTATOR CUFF REPAIR  2008   Arthroscopic Repair ([facility])    Social History   Socioeconomic History  . Marital status: Married    Spouse name: Not on file  . Number of children: 2  . Years of education: 72  . Highest  education level: Not on file  Occupational History  . 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 Smoking status: Former Smoker    Packs/day: 1.00    Years: 22.00    Pack years: 22.00    Types: Cigarettes    Quit date: 06/26/2002    Years since quitting: 18.6  . Smokeless tobacco: Never Used  Vaping Use  . Vaping Use: Never used  Substance and Sexual Activity  . Alcohol use: Yes    Comment: rare  . Drug use: No  . Sexual activity: Yes    Partners: Female  Other Topics Concern  . Not on file  Social History Narrative   HSG, 2 yrs [employer]. Married '71. 2 daughters - '74, '77; 2 grandchildren. Marriage in [place]. Retired -but working part-time for [employer]. POA wife. Full resuscitation, short-term mechanical ventilation.      Fun: Work in the yard, fishing at [employer]   Denies religious beliefs effecting health care.          Social Determinants of Health   Financial Resource Strain: Not on file  Food Insecurity: Not on file  Transportation Needs: Not on file  Physical Activity: Not on file  Stress: Not on file  Social Connections: Not on file    Family History  Problem Relation Age of Onset  . Hypertension Mother   . Dementia Mother   . Prostate cancer Father   . Coronary artery disease Father   . Cancer Father        lung cancer  . Heart disease Father        CAD/CABG  . Healthy Maternal Grandmother   . Testicular cancer Maternal Grandfather   . Healthy Paternal Grandmother   . Hiatal hernia Paternal Grandfather   . Diabetes Neg Hx   . Heart attack Neg Hx   . Colon cancer Neg Hx   . Hyperlipidemia Neg Hx   . Rectal cancer Neg Hx   . Stomach cancer Neg Hx     Review of Systems  Constitutional: Negative for chills and fever.  Eyes: Negative for visual disturbance.  Respiratory: Negative for cough, shortness of breath and wheezing.   Cardiovascular: Negative for chest pain, palpitations and leg swelling.  Gastrointestinal: Negative for abdominal pain, blood in stool, constipation, diarrhea and nausea.       No gerd  Genitourinary: Negative for difficulty urinating, dysuria and hematuria.  Musculoskeletal: Negative for arthralgias and back pain.       "old age pain"  Skin: Negative for rash.  Neurological: Negative for light-headedness and headaches.  Psychiatric/Behavioral: Negative for dysphoric mood. The patient is not nervous/anxious.        Objective:   Vitals:   02/10/21 0914  BP: 124/78  Pulse: 84  Temp: 98 F (36.7 C)  SpO2: 97%   Filed Weights   02/10/21 0914  Weight: 199 lb (90.3 kg)   Body mass index is 27.75 kg/m.  BP Readings from Last 3 Encounters:  02/10/21 124/78  02/03/21  130/64  08/23/20 (!) 144/86    Wt Readings from Last 3 Encounters:  02/10/21 199 lb (90.3 kg)  02/03/21 198 lb (89.8 kg)  08/23/20 201 lb (91.2 kg)     Physical Exam Constitutional: He appears well-developed and well-nourished. No distress.  HENT:  Head: Normocephalic and  atraumatic.  Right Ear: External ear normal.  Left Ear: External ear normal.  Mouth/Throat: Oropharynx is clear and moist.  Normal ear canals and TM b/l  Eyes: Conjunctivae and EOM are normal.  Neck: Neck supple. No tracheal deviation present. No thyromegaly present.  No carotid bruit  Cardiovascular: Normal rate, regular rhythm, normal heart sounds and intact distal pulses.   No murmur heard. Pulmonary/Chest: Effort normal and breath sounds normal. No respiratory distress. He has no wheezes. He has no rales.  Abdominal: Soft. He exhibits no distension. There is no tenderness.  Genitourinary: deferred  Musculoskeletal: He exhibits no edema.  Lymphadenopathy:   He has no cervical adenopathy.  Skin: Skin is warm and dry. He is not diaphoretic.  Psychiatric: He has a normal mood and affect. His behavior is normal.         Assessment & Plan:   Physical exam: Screening blood work  ordered Immunizations  Discussed shingrix, covid - no vaccines Colonoscopy   Up to date  Eye exams  Due - advised eye exam Exercise   Walking at least 1 mile a day - more on weekends, yard work [employer] - working on weight loss -  Improving his diet - eating less red meat and saturated fat - eating more veges Substance abuse   none  See Problem List for Assessment and Plan of chronic medical problems.   This visit occurred during the SARS-CoV-2 public health emergency.  Safety protocols were in place, including screening questions prior to the visit, additional usage of staff PPE, and extensive cleaning of exam room while observing appropriate contact time as indicated for disinfecting solutions.

## 2021-02-09 NOTE — Patient Instructions (Addendum)
Blood work was ordered.     Medications changes include :   none     Please followup in 1 year    Health Maintenance, Male Adopting a healthy lifestyle and getting preventive care are important in promoting health and wellness. Ask your health care provider about:  The right schedule for you to have regular tests and exams.  Things you can do on your own to prevent diseases and keep yourself healthy. What should I know about diet, weight, and exercise? Eat a healthy diet  Eat a diet that includes plenty of vegetables, fruits, low-fat dairy products, and lean protein.  Do not eat a lot of foods that are high in solid fats, added sugars, or sodium.   Maintain a healthy weight Body mass index (BMI) is a measurement that can be used to identify possible weight problems. It estimates body fat based on height and weight. Your health care provider can help determine your BMI and help you achieve or maintain a healthy weight. Get regular exercise Get regular exercise. This is one of the most important things you can do for your health. Most adults should:  Exercise for at least 150 minutes each week. The exercise should increase your heart rate and make you sweat (moderate-intensity exercise).  Do strengthening exercises at least twice a week. This is in addition to the moderate-intensity exercise.  Spend less time sitting. Even light physical activity can be beneficial. Watch cholesterol and blood lipids Have your blood tested for lipids and cholesterol at 72 years of age, then have this test every 5 years. You may need to have your cholesterol levels checked more often if:  Your lipid or cholesterol levels are high.  You are older than 72 years of age.  You are at high risk for heart disease. What should I know about cancer screening? Many types of cancers can be detected early and may often be prevented. Depending on your health history and family history, you may need to  have cancer screening at various ages. This may include screening for:  Colorectal cancer.  Prostate cancer.  Skin cancer.  Lung cancer. What should I know about heart disease, diabetes, and high blood pressure? Blood pressure and heart disease  High blood pressure causes heart disease and increases the risk of stroke. This is more likely to develop in people who have high blood pressure readings, are of African descent, or are overweight.  Talk with your health care provider about your target blood pressure readings.  Have your blood pressure checked: ? Every 3-5 years if you are 69-10 years of age. ? Every year if you are 22 years old or older.  If you are between the ages of 74 and 61 and are a current or former smoker, ask your health care provider if you should have a one-time screening for abdominal aortic aneurysm (AAA). Diabetes Have regular diabetes screenings. This checks your fasting blood sugar level. Have the screening done:  Once every three years after age 15 if you are at a normal weight and have a low risk for diabetes.  More often and at a younger age if you are overweight or have a high risk for diabetes. What should I know about preventing infection? Hepatitis B If you have a higher risk for hepatitis B, you should be screened for this virus. Talk with your health care provider to find out if you are at risk for hepatitis B infection. Hepatitis C Blood testing is recommended  for:  Everyone born from [birth year] through 1965.  Anyone with known risk factors for hepatitis C. Sexually transmitted infections (STIs)  You should be screened each year for STIs, including gonorrhea and chlamydia, if: ? You are sexually active and are younger than 72 years of age. ? You are older than 72 years of age and your health care provider tells you that you are at risk for this type of infection. ? Your sexual activity has changed since you were last screened, and you are at  increased risk for chlamydia or gonorrhea. Ask your health care provider if you are at risk.  Ask your health care provider about whether you are at high risk for HIV. Your health care provider may recommend a prescription medicine to help prevent HIV infection. If you choose to take medicine to prevent HIV, you should first get tested for HIV. You should then be tested every 3 months for as long as you are taking the medicine. Follow these instructions at home: Lifestyle  Do not use any products that contain nicotine or tobacco, such as cigarettes, e-cigarettes, and chewing tobacco. If you need help quitting, ask your health care provider.  Do not use street drugs.  Do not share needles.  Ask your health care provider for help if you need support or information about quitting drugs. Alcohol use  Do not drink alcohol if your health care provider tells you not to drink.  If you drink alcohol: ? Limit how much you have to 0-2 drinks a day. ? Be aware of how much alcohol is in your drink. In the [place] one drink equals one 12 oz bottle of beer (355 mL), one 5 oz glass of wine (148 mL), or one 1 oz glass of hard liquor (44 mL). General instructions  Schedule regular health, dental, and eye exams.  Stay current with your vaccines.  Tell your health care provider if: ? You often feel depressed. ? You have ever been abused or do not feel safe at home. Summary  Adopting a healthy lifestyle and getting preventive care are important in promoting health and wellness.  Follow your health care provider's instructions about healthy diet, exercising, and getting tested or screened for diseases.  Follow your health care provider's instructions on monitoring your cholesterol and blood pressure. This information is not intended to replace advice given to you by your health care provider. Make sure you discuss any questions you have with your health care provider. Document Revised: 10/16/2018  Document Reviewed: 10/16/2018 Elsevier Patient Education  2021 [employer].

## 2021-02-10 ENCOUNTER — Other Ambulatory Visit: Payer: Self-pay

## 2021-02-10 ENCOUNTER — Ambulatory Visit: Payer: PPO

## 2021-02-10 ENCOUNTER — Ambulatory Visit (INDEPENDENT_AMBULATORY_CARE_PROVIDER_SITE_OTHER): Payer: PPO | Admitting: Internal Medicine

## 2021-02-10 VITALS — BP 124/78 | HR 84 | Temp 98.0°F | Ht 71.0 in | Wt 199.0 lb

## 2021-02-10 DIAGNOSIS — I2510 Atherosclerotic heart disease of native coronary artery without angina pectoris: Secondary | ICD-10-CM

## 2021-02-10 DIAGNOSIS — R7303 Prediabetes: Secondary | ICD-10-CM

## 2021-02-10 DIAGNOSIS — E782 Mixed hyperlipidemia: Secondary | ICD-10-CM

## 2021-02-10 DIAGNOSIS — Z0000 Encounter for general adult medical examination without abnormal findings: Secondary | ICD-10-CM | POA: Diagnosis not present

## 2021-02-10 DIAGNOSIS — R197 Diarrhea, unspecified: Secondary | ICD-10-CM | POA: Diagnosis not present

## 2021-02-10 LAB — COMPREHENSIVE METABOLIC PANEL
ALT: 23 U/L (ref 0–53)
AST: 22 U/L (ref 0–37)
Albumin: 4.4 g/dL (ref 3.5–5.2)
Alkaline Phosphatase: 60 U/L (ref 39–117)
BUN: 23 mg/dL (ref 6–23)
CO2: 31 mEq/L (ref 19–32)
Calcium: 9.7 mg/dL (ref 8.4–10.5)
Chloride: 102 mEq/L (ref 96–112)
Creatinine, Ser: 1.18 mg/dL (ref 0.40–1.50)
GFR: 62.12 mL/min (ref >60.00)
Glucose, Bld: 102 mg/dL — ABNORMAL HIGH (ref 70–99)
Potassium: 4.7 mEq/L (ref 3.5–5.1)
Sodium: 137 mEq/L (ref 135–145)
Total Bilirubin: 0.5 mg/dL (ref 0.2–1.2)
Total Protein: 6.5 g/dL (ref 6.0–8.3)

## 2021-02-10 LAB — CBC WITH DIFFERENTIAL/PLATELET
Basophils Absolute: 0.1 10*3/uL (ref 0.0–0.1)
Basophils Relative: 1.1 % (ref 0.0–3.0)
Eosinophils Absolute: 0.2 10*3/uL (ref 0.0–0.7)
Eosinophils Relative: 2.7 % (ref 0.0–5.0)
HCT: 43.6 % (ref 39.0–52.0)
Hemoglobin: 15.1 g/dL (ref 13.0–17.0)
Lymphocytes Relative: 25.2 % (ref 12.0–46.0)
Lymphs Abs: 1.5 10*3/uL (ref 0.7–4.0)
MCHC: 34.5 g/dL (ref 30.0–36.0)
MCV: 93.4 fl (ref 78.0–100.0)
Monocytes Absolute: 0.5 10*3/uL (ref 0.1–1.0)
Monocytes Relative: 7.8 % (ref 3.0–12.0)
Neutro Abs: 3.8 10*3/uL (ref 1.4–7.7)
Neutrophils Relative %: 63.2 % (ref 43.0–77.0)
Platelets: 200 10*3/uL (ref 150.0–400.0)
RBC: 4.67 Mil/uL (ref 4.22–5.81)
RDW: 13.4 % (ref 11.5–15.5)
WBC: 5.9 10*3/uL (ref 4.0–10.5)

## 2021-02-10 LAB — HEMOGLOBIN A1C: Hgb A1c MFr Bld: 5.7 % (ref 4.6–6.5)

## 2021-02-10 LAB — TSH: TSH: 1.07 u[IU]/mL (ref 0.35–4.50)

## 2021-02-10 NOTE — Assessment & Plan Note (Addendum)
In the past year he has had 2 episodes of what sounds like more severe diarrhea Not infectious Cause not identified He did take his wife's hydroxy chloroquine 200 mg twice daily for 1 week and it stopped both episodes Probable colitis Currently asymptomatic Encouraged him to follow-up with GI if he has symptoms again so that he can have a colonoscopy and get the proper treatment

## 2021-02-10 NOTE — Assessment & Plan Note (Signed)
New since her last visit He has changed his eating habits, is exercising and working on weight loss He deferred starting a statin as requested by cardiology, but is taking Zetia and will reconsider a statin depending on his numbers in the near future No chest pain, shortness of breath or palpitations CBC, CMP, TSH

## 2021-02-10 NOTE — Assessment & Plan Note (Signed)
Chronic Check a1c Low sugar / carb diet Stressed regular exercise  

## 2021-02-10 NOTE — Assessment & Plan Note (Signed)
Chronic Currently taking Zetia 10 mg daily Cardiology managing-may need a statin, which he understands but would ideally like to avoid Continue heart healthy diet and regular exercise Working on weight loss

## 2021-02-17 ENCOUNTER — Ambulatory Visit (INDEPENDENT_AMBULATORY_CARE_PROVIDER_SITE_OTHER): Payer: PPO

## 2021-02-17 VITALS — BP 118/70 | HR 65 | Temp 98.0°F | Ht 71.0 in | Wt 195.2 lb

## 2021-02-17 DIAGNOSIS — Z0000 Encounter for general adult medical examination without abnormal findings: Secondary | ICD-10-CM | POA: Diagnosis not present

## 2021-02-17 NOTE — Progress Notes (Signed)
Subjective:   Richard Novak is a 70 y.o. male who presents for Medicare Annual/Subsequent preventive examination.  Review of Systems    No ROS. Medicare Wellness Visit. Additional risk factors are reflected in social history. Cardiac Risk Factors include: advanced age (>8men, >37 women);dyslipidemia;family history of premature cardiovascular disease;male gender Sleep Patterns: No sleep issues, feels rested on waking and sleeps 6-8 hours nightly. Home Safety/Smoke Alarms: Feels safe in home; uses home alarm. Smoke alarms in place. Living environment: 1-story home; Lives with spouse; no needs for DME; good family support system. Seat Belt Safety/Bike Helmet: Wears seat belt.    Objective:    Today's Vitals   02/17/21 0815  BP: 118/70  Pulse: 65  Temp: 98 F (36.7 C)  SpO2: 97%  Weight: 195 lb 3.2 oz (88.5 kg)  Height: 5\' 11"  (1.803 m)  PainSc: 0-No pain   Body mass index is 27.22 kg/m.  Advanced Directives 02/17/2021 07/25/2018 10/04/2016 10/03/2016  Does Patient Have a Medical Advance Directive? Yes Yes Yes Yes  Type of Advance Directive Living will;Healthcare Power of [place];Living will Living will Living will  Does patient want to make changes to medical advance directive? No - Patient declined - No - Patient declined -  Copy of [place] in Chart? No - copy requested No - copy requested - -    Current Medications (verified) Outpatient Encounter Medications as of 02/17/2021  Medication Sig  . aspirin EC 81 MG tablet Take 81 mg by mouth daily. Swallow whole.  . ezetimibe (ZETIA) 10 MG tablet Take 1 tablet (10 mg total) by mouth daily.  . Flax Oil-Fish Oil-Borage Oil (FISH OIL-FLAX OIL-BORAGE OIL) CAPS Take 2 capsules by mouth 2 (two) times daily.   . fluticasone (FLONASE) 50 MCG/ACT nasal spray Place 2 sprays into both nostrils as needed for allergies or rhinitis.  . NON FORMULARY Take 1 capsule by mouth 2 (two) times  daily. Herbal blend for prostate  . vitamin C (ASCORBIC ACID) 500 MG tablet Take 500 mg by mouth 2 (two) times daily.  . Vitamin D, Cholecalciferol, 1000 units CAPS Take 1,000 Units by mouth daily.   . vitamin E 100 UNIT capsule Take 100 Units by mouth 2 (two) times daily.   Marland Kitchen ZINC PICOLINATE PO Take 50 mg by mouth daily.    No facility-administered encounter medications on file as of 02/17/2021.    Allergies (verified) Sulfa antibiotics   History: Past Medical History:  Diagnosis Date  . Bursitis of elbow    left  . Nongonococcal urethritis (NGU) due to other specified organism   . Personal history of colonic adenomas 03/18/2013  . Rotator cuff injury    Left  . Tendinitis of left elbow   . Urticaria    Past Surgical History:  Procedure Laterality Date  . APPENDECTOMY  1968  . COLONOSCOPY    . OLECRANON BURSECTOMY Left 10/04/2016   Procedure: LEFT OLECRANON BURSECTOMY;  Surgeon: Leandrew Koyanagi, MD;  Location: [facility];  Service: Orthopedics;  Laterality: Left;  . ROTATOR CUFF REPAIR  2008   Arthroscopic Repair ([facility])   Family History  Problem Relation Age of Onset  . Hypertension Mother   . Dementia Mother   . Prostate cancer Father   . Coronary artery disease Father   . Cancer Father        lung cancer  . Heart disease Father        CAD/CABG  .  Healthy Maternal Grandmother   . Testicular cancer Maternal Grandfather   . Healthy Paternal Grandmother   . Hiatal hernia Paternal Grandfather   . Diabetes Neg Hx   . Heart attack Neg Hx   . Colon cancer Neg Hx   . Hyperlipidemia Neg Hx   . Rectal cancer Neg Hx   . Stomach cancer Neg Hx    Social History   Socioeconomic History  . Marital status: Married    Spouse name: Not on file  . Number of children: 2  . Years of education: 61  . Highest education level: Not on file  Occupational History  . 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 Smoking status: Former Smoker    Packs/day: 1.00    Years: 22.00    Pack years: 22.00    Types: Cigarettes    Quit date: 06/26/2002    Years since quitting: 18.6  . Smokeless tobacco: Never Used  Vaping Use  . Vaping Use: Never used  Substance and Sexual Activity  . Alcohol use: Yes    Comment: rare  . Drug use: No  . Sexual activity: Yes    Partners: Female  Other Topics Concern  . Not on file  Social History Narrative   HSG, 2 yrs [employer]. Married '71. 2 daughters - '74, '77; 2 grandchildren. Marriage in [place]. Retired -but working part-time for [employer]. POA wife. Full resuscitation, short-term mechanical ventilation.      Fun: Work in the yard, fishing at [employer]   Denies religious beliefs effecting health care.          Social Determinants of Health   Financial Resource Strain: Low Risk   . Difficulty of Paying Living Expenses: Not hard at all  Food Insecurity: No Food Insecurity  . Worried About Charity fundraiser in the Last Year: Never true  . Ran Out of Food in the Last Year: Never true  Transportation Needs: No Transportation Needs  . Lack of Transportation (Medical): No  . Lack of Transportation (Non-Medical): No  Physical Activity: Sufficiently Active  . Days of Exercise per Week: 7 days  . Minutes of Exercise per Session: 30 min  Stress: No Stress Concern Present  . Feeling of Stress : Not at all  Social Connections: Socially Integrated  . Frequency of Communication with Friends and Family: More than three times a week  . Frequency of Social Gatherings with Friends and Family: More than three times a week  . Attends Religious Services: More than 4 times per year  . Active Member of Clubs or Organizations: Yes  . Attends Archivist Meetings: More than 4 times per year  . Marital Status: Married    Tobacco Counseling Counseling given: Not  Answered   Clinical Intake:  Pre-visit preparation completed: Yes  Pain : No/denies pain Pain Score: 0-No pain     BMI - recorded: 27.22 Nutritional Status: BMI 25 -29 Overweight Nutritional Risks: None Diabetes: No  How often do you need to have someone help you when you read instructions, pamphlets, or other written materials from your doctor or pharmacy?: 1 - Never What is the last grade level you completed in school?: [employer]; 2 years [employer]  Diabetic? no  Interpreter Needed?: No  Information entered by :: Lisette Abu, LPN  Activities of Daily Living In your present state of health, do you have any difficulty performing the following activities: 02/17/2021 02/10/2021  Hearing? N N  Vision? N N  Difficulty concentrating or making decisions? N N  Walking or climbing stairs? N N  Dressing or bathing? N N  Doing errands, shopping? N N  Preparing Food and eating ? N -  Using the Toilet? N -  In the past six months, have you accidently leaked urine? N -  Do you have problems with loss of bowel control? N -  Managing your Medications? N -  Managing your Finances? N -  Housekeeping or managing your Housekeeping? N -  Some recent data might be hidden    Patient Care Team: Binnie Rail, MD as PCP - General (Internal Medicine) Gatha Mayer, MD as Consulting Physician (Gastroenterology)  Indicate any recent Medical Services you may have received from other than [facility] providers in the past year (date may be approximate).     Assessment:   This is a routine wellness examination for Richard Novak.  Hearing/Vision screen No exam data present  Dietary issues and exercise activities discussed: Current Exercise Habits: Home exercise routine, Type of exercise: walking, Time (Minutes): 30, Frequency (Times/Week): 7, Weekly Exercise (Minutes/Week): 210, Intensity: Moderate, Exercise limited by: None identified  Goals    .  Patient Stated      Stay as healthy  and as independent as possible. Enjoy life and travel to [place] with family.    .  Patient Stated (pt-stated)      Get cholesterol under control and watch intake of fats/eat more vegetables.      Depression Screen PHQ 2/9 Scores 02/17/2021 02/13/2020 07/25/2018 10/19/2016 06/09/2015  PHQ - 2 Score 0 0 0 0 0    Fall Risk Fall Risk  02/17/2021 02/13/2020 09/29/2019 07/25/2018 04/03/2018  Falls in the past year? 0 0 0 No No  Comment - - Emmi Telephone Survey: data to providers prior to load - -  Number falls in past yr: 0 0 - - -  Injury with Fall? 0 0 - - -  Risk for fall due to : No Fall Risks No Fall Risks - - -  Follow up Falls evaluation completed Falls evaluation completed - - -    FALL RISK PREVENTION PERTAINING TO THE HOME:  Any stairs in or around the home? No  If so, are there any without handrails? No  Home free of loose throw rugs in walkways, pet beds, electrical cords, etc? Yes  Adequate lighting in your home to reduce risk of falls? Yes   ASSISTIVE DEVICES UTILIZED TO PREVENT FALLS:  Life alert? No  Use of a cane, walker or w/c? No  Grab bars in the bathroom? No  Shower chair or bench in shower? No  Elevated toilet seat or a handicapped toilet? Yes   TIMED UP AND GO:  Was the test performed? No .  Length of time to ambulate 10 feet: 0 sec.   Gait steady and fast without use of assistive device  Cognitive Function: Normal cognitive status assessed by direct observation by this Nurse Health Advisor. No abnormalities found.          Immunizations Immunization History  Administered Date(s) Administered  . Pneumococcal Conjugate-13 06/09/2015  . Pneumococcal Polysaccharide-23 10/19/2016  . Tdap 10/19/2016    TDAP status: Up to date  Flu Vaccine status: Declined, Education has been provided regarding the importance of this vaccine but patient still  declined. Advised may receive this vaccine at local pharmacy or Health Dept. Aware to provide a copy of the  vaccination record if obtained from local pharmacy or Health Dept. Verbalized acceptance and understanding.  Pneumococcal vaccine status: Up to date  Covid-19 vaccine status: Declined, Education has been provided regarding the importance of this vaccine but patient still declined. Advised may receive this vaccine at local pharmacy or Health Dept.or vaccine clinic. Aware to provide a copy of the vaccination record if obtained from local pharmacy or Health Dept. Verbalized acceptance and understanding.  Qualifies for Shingles Vaccine? Yes   Zostavax completed No   Shingrix Completed?: No.    Education has been provided regarding the importance of this vaccine. Patient has been advised to call insurance company to determine out of pocket expense if they have not yet received this vaccine. Advised may also receive vaccine at local pharmacy or Health Dept. Verbalized acceptance and understanding.  Screening Tests Health Maintenance  Topic Date Due  . COVID-19 Vaccine (1) 02/26/2021 (Originally 09/10/1956)  . INFLUENZA VACCINE  06/06/2021  . COLONOSCOPY (Pts 45-15yrs Insurance coverage will need to be confirmed)  06/29/2023  . TETANUS/TDAP  10/19/2026  . Hepatitis C Screening  Completed  . PNA vac Low Risk Adult  Completed  . HPV VACCINES  Aged Out    Health Maintenance  There are no preventive care reminders to display for this patient.  Colorectal cancer screening: Type of screening: Colonoscopy. Completed 06/28/2018. Repeat every 5 years  Lung Cancer Screening: (Low Dose CT Chest recommended if Age 60-80 years, 30 pack-year currently smoking OR have quit w/in 15years.) does not qualify.   Lung Cancer Screening Referral: no  Additional Screening:  Hepatitis C Screening: does qualify; Completed yes  Vision Screening: Recommended annual ophthalmology exams for early detection of glaucoma and other disorders of the eye. Is the patient up to date with their annual eye exam?  No  Who is  the provider or what is the name of the office in which the patient attends annual eye exams? N/A If pt is not established with a provider, would they like to be referred to a provider to establish care? No .   Dental Screening: Recommended annual dental exams for proper oral hygiene  Community Resource Referral / Chronic Care Management: CRR required this visit?  No   CCM required this visit?  No      Plan:     I have personally reviewed and noted the following in the patient's chart:   . Medical and social history . Use of alcohol, tobacco or illicit drugs  . Current medications and supplements . Functional ability and status . Nutritional status . Physical activity . Advanced directives . List of other physicians . Hospitalizations, surgeries, and ER visits in previous 12 months . Vitals . Screenings to include cognitive, depression, and falls . Referrals and appointments  In addition, I have reviewed and discussed with patient certain preventive protocols, quality metrics, and best practice recommendations. A written personalized care plan for preventive services as well as general preventive health recommendations were provided to patient.     Sheral Flow, LPN   [id number]/85/8850   Nurse Notes:  Medications reviewed with patient; no opioid use noted.

## 2021-02-17 NOTE — Patient Instructions (Signed)
Richard Novak , Thank you for taking time to come for your Medicare Wellness Visit. I appreciate your ongoing commitment to your health goals. Please review the following plan we discussed and let me know if I can assist you in the future.   Screening recommendations/referrals: Colonoscopy: 06/28/2018; due every 5 years Recommended yearly ophthalmology/optometry visit for glaucoma screening and checkup Recommended yearly dental visit for hygiene and checkup  Vaccinations: Influenza vaccine: declined Pneumococcal vaccine: 8/63/2016, 10/19/2016 Tdap vaccine: 10/19/2016; due every 10 years Shingles vaccine: Please call your insurance company to determine your out of pocket expense for the Shingrix vaccine. You may receive this vaccine at your local pharmacy.   Covid-19: declined  Advanced directives: Please bring a copy of your health care power of attorney and living will to the office at your convenience.  Conditions/risks identified: Yes; Reviewed health maintenance screenings with patient today and relevant education, vaccines, and/or referrals were provided. Please continue to do your personal lifestyle choices by: daily care of teeth and gums, regular physical activity (goal should be 5 days a week for 30 minutes), eat a healthy diet, avoid tobacco and drug use, limiting any alcohol intake, taking a low-dose aspirin (if not allergic or have been advised by your provider otherwise) and taking vitamins and minerals as recommended by your provider. Continue doing brain stimulating activities (puzzles, reading, adult coloring books, staying active) to keep memory sharp. Continue to eat heart healthy diet (full of fruits, vegetables, whole grains, lean protein, water--limit salt, fat, and sugar intake) and increase physical activity as tolerated.  Next appointment: Please schedule your next Medicare Wellness Visit with your Nurse Health Advisor in 1 year by calling [phone].  Preventive Care 72  Years and Older, Male Preventive care refers to lifestyle choices and visits with your health care provider that can promote health and wellness. What does preventive care include?  A yearly physical exam. This is also called an annual well check.  Dental exams once or twice a year.  Routine eye exams. Ask your health care provider how often you should have your eyes checked.  Personal lifestyle choices, including:  Daily care of your teeth and gums.  Regular physical activity.  Eating a healthy diet.  Avoiding tobacco and drug use.  Limiting alcohol use.  Practicing safe sex.  Taking low doses of aspirin every day.  Taking vitamin and mineral supplements as recommended by your health care provider. What happens during an annual well check? The services and screenings done by your health care provider during your annual well check will depend on your age, overall health, lifestyle risk factors, and family history of disease. Counseling  Your health care provider may ask you questions about your:  Alcohol use.  Tobacco use.  Drug use.  Emotional well-being.  Home and relationship well-being.  Sexual activity.  Eating habits.  History of falls.  Memory and ability to understand (cognition).  Work and work Statistician. Screening  You may have the following tests or measurements:  Height, weight, and BMI.  Blood pressure.  Lipid and cholesterol levels. These may be checked every 5 years, or more frequently if you are over 72 years old.  Skin check.  Lung cancer screening. You may have this screening every year starting at age 72 if you have a 30-pack-year history of smoking and currently smoke or have quit within the past 15 years.  Fecal occult blood test (FOBT) of the stool. You may have this test every year starting at age  72.  Flexible sigmoidoscopy or colonoscopy. You may have a sigmoidoscopy every 5 years or a colonoscopy every 10 years starting at  age 72.  Prostate cancer screening. Recommendations will vary depending on your family history and other risks.  Hepatitis C blood test.  Hepatitis B blood test.  Sexually transmitted disease (STD) testing.  Diabetes screening. This is done by checking your blood sugar (glucose) after you have not eaten for a while (fasting). You may have this done every 1-3 years.  Abdominal aortic aneurysm (AAA) screening. You may need this if you are a current or former smoker.  Osteoporosis. You may be screened starting at age 72 if you are at high risk. Talk with your health care provider about your test results, treatment options, and if necessary, the need for more tests. Vaccines  Your health care provider may recommend certain vaccines, such as:  Influenza vaccine. This is recommended every year.  Tetanus, diphtheria, and acellular pertussis (Tdap, Td) vaccine. You may need a Td booster every 10 years.  Zoster vaccine. You may need this after age 72.  Pneumococcal 13-valent conjugate (PCV13) vaccine. One dose is recommended after age 72.  Pneumococcal polysaccharide (PPSV23) vaccine. One dose is recommended after age 72. Talk to your health care provider about which screenings and vaccines you need and how often you need them. This information is not intended to replace advice given to you by your health care provider. Make sure you discuss any questions you have with your health care provider. Document Released: 11/19/2015 Document Revised: 07/12/2016 Document Reviewed: 08/24/2015 Elsevier Interactive Patient Education  2017 [place] Prevention in the Home Falls can cause injuries. They can happen to people of all ages. There are many things you can do to make your home safe and to help prevent falls. What can I do on the outside of my home?  Regularly fix the edges of walkways and driveways and fix any cracks.  Remove anything that might make you trip as you walk through  a door, such as a raised step or threshold.  Trim any bushes or trees on the path to your home.  Use bright outdoor lighting.  Clear any walking paths of anything that might make someone trip, such as rocks or tools.  Regularly check to see if handrails are loose or broken. Make sure that both sides of any steps have handrails.  Any raised decks and porches should have guardrails on the edges.  Have any leaves, snow, or ice cleared regularly.  Use sand or salt on walking paths during winter.  Clean up any spills in your garage right away. This includes oil or grease spills. What can I do in the bathroom?  Use night lights.  Install grab bars by the toilet and in the tub and shower. Do not use towel bars as grab bars.  Use non-skid mats or decals in the tub or shower.  If you need to sit down in the shower, use a plastic, non-slip stool.  Keep the floor dry. Clean up any water that spills on the floor as soon as it happens.  Remove soap buildup in the tub or shower regularly.  Attach bath mats securely with double-sided non-slip rug tape.  Do not have throw rugs and other things on the floor that can make you trip. What can I do in the bedroom?  Use night lights.  Make sure that you have a light by your bed that is easy to reach.  Do not use any sheets or blankets that are too big for your bed. They should not hang down onto the floor.  Have a firm chair that has side arms. You can use this for support while you get dressed.  Do not have throw rugs and other things on the floor that can make you trip. What can I do in the kitchen?  Clean up any spills right away.  Avoid walking on wet floors.  Keep items that you use a lot in easy-to-reach places.  If you need to reach something above you, use a strong step stool that has a grab bar.  Keep electrical cords out of the way.  Do not use floor polish or wax that makes floors slippery. If you must use wax, use  non-skid floor wax.  Do not have throw rugs and other things on the floor that can make you trip. What can I do with my stairs?  Do not leave any items on the stairs.  Make sure that there are handrails on both sides of the stairs and use them. Fix handrails that are broken or loose. Make sure that handrails are as long as the stairways.  Check any carpeting to make sure that it is firmly attached to the stairs. Fix any carpet that is loose or worn.  Avoid having throw rugs at the top or bottom of the stairs. If you do have throw rugs, attach them to the floor with carpet tape.  Make sure that you have a light switch at the top of the stairs and the bottom of the stairs. If you do not have them, ask someone to add them for you. What else can I do to help prevent falls?  Wear shoes that:  Do not have high heels.  Have rubber bottoms.  Are comfortable and fit you well.  Are closed at the toe. Do not wear sandals.  If you use a stepladder:  Make sure that it is fully opened. Do not climb a closed stepladder.  Make sure that both sides of the stepladder are locked into place.  Ask someone to hold it for you, if possible.  Clearly mark and make sure that you can see:  Any grab bars or handrails.  First and last steps.  Where the edge of each step is.  Use tools that help you move around (mobility aids) if they are needed. These include:  Canes.  Walkers.  Scooters.  Crutches.  Turn on the lights when you go into a dark area. Replace any light bulbs as soon as they burn out.  Set up your furniture so you have a clear path. Avoid moving your furniture around.  If any of your floors are uneven, fix them.  If there are any pets around you, be aware of where they are.  Review your medicines with your doctor. Some medicines can make you feel dizzy. This can increase your chance of falling. Ask your doctor what other things that you can do to help prevent falls. This  information is not intended to replace advice given to you by your health care provider. Make sure you discuss any questions you have with your health care provider. Document Released: 08/19/2009 Document Revised: 03/30/2016 Document Reviewed: 11/27/2014 Elsevier Interactive Patient Education  2017 [employer].

## 2021-05-05 DIAGNOSIS — N528 Other male erectile dysfunction: Secondary | ICD-10-CM | POA: Diagnosis not present

## 2021-05-05 DIAGNOSIS — Z125 Encounter for screening for malignant neoplasm of prostate: Secondary | ICD-10-CM | POA: Diagnosis not present

## 2021-05-12 DIAGNOSIS — N528 Other male erectile dysfunction: Secondary | ICD-10-CM | POA: Diagnosis not present

## 2021-05-12 DIAGNOSIS — Z125 Encounter for screening for malignant neoplasm of prostate: Secondary | ICD-10-CM | POA: Diagnosis not present

## 2021-06-01 ENCOUNTER — Telehealth: Payer: Self-pay | Admitting: Cardiovascular Disease

## 2021-06-01 ENCOUNTER — Other Ambulatory Visit: Payer: Self-pay | Admitting: *Deleted

## 2021-06-01 MED ORDER — EZETIMIBE 10 MG PO TABS: 10.0000 mg | ORAL_TABLET | Freq: Every day | ORAL | 3 refills | Status: DC

## 2021-06-01 NOTE — Telephone Encounter (Signed)
Refill for Zetia 10 mg has been sent to [facility], [address], per pt request.

## 2021-06-01 NOTE — Telephone Encounter (Signed)
*  STAT* If patient is at the pharmacy, call can be transferred to refill team.   1. Which medications need to be refilled? (please list name of each medication and dose if known)  ezetimibe (ZETIA) 10 MG tablet  2. Which pharmacy/location (including street and city if local pharmacy) is medication to be sent to? [id number] [place], [place] - [address]  3. Do they need a 30 day or 90 day supply?  90 day supply

## 2021-08-05 ENCOUNTER — Other Ambulatory Visit: Payer: Self-pay

## 2021-08-05 ENCOUNTER — Other Ambulatory Visit: Payer: PPO | Admitting: *Deleted

## 2021-08-05 DIAGNOSIS — E782 Mixed hyperlipidemia: Secondary | ICD-10-CM | POA: Diagnosis not present

## 2021-08-05 DIAGNOSIS — I2510 Atherosclerotic heart disease of native coronary artery without angina pectoris: Secondary | ICD-10-CM | POA: Diagnosis not present

## 2021-08-05 LAB — LIPID PANEL
Chol/HDL Ratio: 3.4 ratio (ref 0.0–5.0)
Cholesterol, Total: 150 mg/dL (ref 100–199)
HDL: 44 mg/dL (ref >39)
LDL Chol Calc (NIH): 85 mg/dL (ref 0–99)
Triglycerides: 118 mg/dL (ref 0–149)
VLDL Cholesterol Cal: 21 mg/dL (ref 5–40)

## 2022-02-01 ENCOUNTER — Telehealth: Payer: Self-pay | Admitting: Cardiovascular Disease

## 2022-02-01 NOTE — Telephone Encounter (Signed)
Spoke with patient, he called to ask if he needed to have labs done before appt with Dr. Angelena Form. No pending lab orders or notes regarding labwork seen in chart. ? ?Advised patient Dr. Angelena Form may order labs after he sees him at his appt on 02/03/22 but there is nothing ordered to be collected prior. ? ?Patient verbalized understanding. ?

## 2022-02-01 NOTE — Telephone Encounter (Signed)
Patient has been scheduled to see Dr. Angelena Form on 03/31. He is wanting to know if Dr. Angelena Form wants labs for this appt. Please advise.  ?

## 2022-02-03 ENCOUNTER — Ambulatory Visit: Payer: PPO | Admitting: Cardiovascular Disease

## 2022-02-16 ENCOUNTER — Encounter: Payer: Self-pay | Admitting: Internal Medicine

## 2022-02-16 NOTE — Patient Instructions (Addendum)
? ? ? ?Blood work was ordered.   ? ? ?Medications changes include :   None ? ? ? ?Return in about 1 year (around 02/18/2023) for CPE. ? ? ? ?Health Maintenance, Male ?Adopting a healthy lifestyle and getting preventive care are important in promoting health and wellness. Ask your health care provider about: ?The right schedule for you to have regular tests and exams. ?Things you can do on your own to prevent diseases and keep yourself healthy. ?What should I know about diet, weight, and exercise? ?Eat a healthy diet ? ?Eat a diet that includes plenty of vegetables, fruits, low-fat dairy products, and lean protein. ?Do not eat a lot of foods that are high in solid fats, added sugars, or sodium. ?Maintain a healthy weight ?Body mass index (BMI) is a measurement that can be used to identify possible weight problems. It estimates body fat based on height and weight. Your health care provider can help determine your BMI and help you achieve or maintain a healthy weight. ?Get regular exercise ?Get regular exercise. This is one of the most important things you can do for your health. Most adults should: ?Exercise for at least 150 minutes each week. The exercise should increase your heart rate and make you sweat (moderate-intensity exercise). ?Do strengthening exercises at least twice a week. This is in addition to the moderate-intensity exercise. ?Spend less time sitting. Even light physical activity can be beneficial. ?Watch cholesterol and blood lipids ?Have your blood tested for lipids and cholesterol at 74 years of age, then have this test every 5 years. ?You may need to have your cholesterol levels checked more often if: ?Your lipid or cholesterol levels are high. ?You are older than 73 years of age. ?You are at high risk for heart disease. ?What should I know about cancer screening? ?Many types of cancers can be detected early and may often be prevented. Depending on your health history and family history, you may  need to have cancer screening at various ages. This may include screening for: ?Colorectal cancer. ?Prostate cancer. ?Skin cancer. ?Lung cancer. ?What should I know about heart disease, diabetes, and high blood pressure? ?Blood pressure and heart disease ?High blood pressure causes heart disease and increases the risk of stroke. This is more likely to develop in people who have high blood pressure readings or are overweight. ?Talk with your health care provider about your target blood pressure readings. ?Have your blood pressure checked: ?Every 3-5 years if you are 78-23 years of age. ?Every year if you are 38 years old or older. ?If you are between the ages of 32 and 96 and are a current or former smoker, ask your health care provider if you should have a one-time screening for abdominal aortic aneurysm (AAA). ?Diabetes ?Have regular diabetes screenings. This checks your fasting blood sugar level. Have the screening done: ?Once every three years after age 87 if you are at a normal weight and have a low risk for diabetes. ?More often and at a younger age if you are overweight or have a high risk for diabetes. ?What should I know about preventing infection? ?Hepatitis B ?If you have a higher risk for hepatitis B, you should be screened for this virus. Talk with your health care provider to find out if you are at risk for hepatitis B infection. ?Hepatitis C ?Blood testing is recommended for: ?Everyone born from [birth year] through 1967. ?Anyone with known risk factors for hepatitis C. ?Sexually transmitted infections (STIs) ?You should  be screened each year for STIs, including gonorrhea and chlamydia, if: ?You are sexually active and are younger than 73 years of age. ?You are older than 73 years of age and your health care provider tells you that you are at risk for this type of infection. ?Your sexual activity has changed since you were last screened, and you are at increased risk for chlamydia or gonorrhea. Ask your health  care provider if you are at risk. ?Ask your health care provider about whether you are at high risk for HIV. Your health care provider may recommend a prescription medicine to help prevent HIV infection. If you choose to take medicine to prevent HIV, you should first get tested for HIV. You should then be tested every 3 months for as long as you are taking the medicine. ?Follow these instructions at home: ?Alcohol use ?Do not drink alcohol if your health care provider tells you not to drink. ?If you drink alcohol: ?Limit how much you have to 0-2 drinks a day. ?Know how much alcohol is in your drink. In the [place] one drink equals one 12 oz bottle of beer (355 mL), one 5 oz glass of wine (148 mL), or one 1? oz glass of hard liquor (44 mL). ?Lifestyle ?Do not use any products that contain nicotine or tobacco. These products include cigarettes, chewing tobacco, and vaping devices, such as e-cigarettes. If you need help quitting, ask your health care provider. ?Do not use street drugs. ?Do not share needles. ?Ask your health care provider for help if you need support or information about quitting drugs. ?General instructions ?Schedule regular health, dental, and eye exams. ?Stay current with your vaccines. ?Tell your health care provider if: ?You often feel depressed. ?You have ever been abused or do not feel safe at home. ?Summary ?Adopting a healthy lifestyle and getting preventive care are important in promoting health and wellness. ?Follow your health care provider's instructions about healthy diet, exercising, and getting tested or screened for diseases. ?Follow your health care provider's instructions on monitoring your cholesterol and blood pressure. ?This information is not intended to replace advice given to you by your health care provider. Make sure you discuss any questions you have with your health care provider. ?Document Revised: 03/14/2021 Document Reviewed: 03/14/2021 ?Elsevier Patient Education ? [place]. ? ?

## 2022-02-16 NOTE — Progress Notes (Signed)
? ? ?Subjective:  ? ? Patient ID: Richard Novak, male    DOB: 01/04/1951, 71 y.o.   MRN: [id number] ? ? ?This visit occurred during the SARS-CoV-2 public health emergency.  Safety protocols were in place, including screening questions prior to the visit, additional usage of staff PPE, and extensive cleaning of exam room while observing appropriate contact time as indicated for disinfecting solutions. ? ? ?HPI ?Richard Novak is here for  ?Chief Complaint  ?Patient presents with  ? Annual Exam  ? ? ?He denies changes in his health.  He has increased stress due to his wife's medical problems.   ? ? ?Medications and allergies reviewed with patient and updated if appropriate. ? ? ?Current Outpatient Medications on File Prior to Visit  ?Medication Sig Dispense Refill  ? aspirin EC 81 MG tablet Take 81 mg by mouth daily. Swallow whole.    ? ezetimibe (ZETIA) 10 MG tablet Take 1 tablet (10 mg total) by mouth daily. 90 tablet 3  ? Flax Oil-Fish Oil-Borage Oil (FISH OIL-FLAX OIL-BORAGE OIL) CAPS Take 2 capsules by mouth 2 (two) times daily.     ? fluticasone (FLONASE) 50 MCG/ACT nasal spray Place 2 sprays into both nostrils as needed for allergies or rhinitis.    ? NON FORMULARY Take 1 capsule by mouth 2 (two) times daily. Herbal blend for prostate    ? vitamin C (ASCORBIC ACID) 500 MG tablet Take 500 mg by mouth 2 (two) times daily.    ? Vitamin D, Cholecalciferol, 1000 units CAPS Take 1,000 Units by mouth daily.     ? vitamin E 100 UNIT capsule Take 100 Units by mouth 2 (two) times daily.     ? ZINC PICOLINATE PO Take 50 mg by mouth daily.     ? ?No current facility-administered medications on file prior to visit.  ? ? ?Review of Systems  ?Constitutional:  Negative for chills and fever.  ?Eyes:  Negative for visual disturbance.  ?Respiratory:  Negative for cough, shortness of breath and wheezing.   ?Cardiovascular:  Negative for chest pain, palpitations and leg swelling.  ?Gastrointestinal:  Negative for abdominal pain, blood in  stool, constipation, diarrhea and nausea.  ?     No gerd  ?Genitourinary:  Negative for dysuria and hematuria.  ?Musculoskeletal:  Negative for arthralgias and back pain.  ?Skin:  Negative for rash.  ?Neurological:  Negative for light-headedness and headaches.  ?Psychiatric/Behavioral:  Negative for dysphoric mood. The patient is not nervous/anxious.   ? ?   ?Objective:  ? ?Vitals:  ? 02/17/22 0955  ?BP: 128/82  ?Pulse: 89  ?Temp: 98.6 ?F (37 ?C)  ?SpO2: 97%  ? ?Filed Weights  ? 02/17/22 0955  ?Weight: 204 lb (92.5 kg)  ? ?Body mass index is 28.45 kg/m?. ? ?BP Readings from Last 3 Encounters:  ?02/17/22 128/82  ?02/17/21 118/70  ?02/10/21 124/78  ? ? ?Wt Readings from Last 3 Encounters:  ?02/17/22 204 lb (92.5 kg)  ?02/17/21 195 lb 3.2 oz (88.5 kg)  ?02/10/21 199 lb (90.3 kg)  ? ? ?  ?Physical Exam ?Constitutional: He appears well-developed and well-nourished. No distress.  ?HENT:  ?Head: Normocephalic and atraumatic.  ?Right Ear: External ear normal.  ?Left Ear: External ear normal.  ?Mouth/Throat: Oropharynx is clear and moist.  ?Normal ear canals and TM b/l  ?Eyes: Conjunctivae and EOM are normal.  ?Neck: Neck supple. No tracheal deviation present. No thyromegaly present.  ?No carotid bruit  ?Cardiovascular: Normal rate, regular rhythm, normal heart  sounds and intact distal pulses.   ?No murmur heard. ?Pulmonary/Chest: Effort normal and breath sounds normal. No respiratory distress. He has no wheezes. He has no rales.  ?Abdominal: Soft. He exhibits no distension. There is no tenderness.  ?Genitourinary: deferred  ?Musculoskeletal: He exhibits no edema.  ?Lymphadenopathy:   He has no cervical adenopathy.  ?Skin: Skin is warm and dry. He is not diaphoretic.  ?Psychiatric: He has a normal mood and affect. His behavior is normal.  ? ? ? ? ?   ?Assessment & Plan:  ? ?Physical exam: ?Screening blood work  ordered ?Exercise   regular - treadmill 1.5 mile ?Weight  encouraged weight loss ?Substance abuse    none ? ? ?Reviewed recommended immunizations.  Will get shingrix at drug store ? ? ?Health Maintenance  ?Topic Date Due  ? Zoster Vaccines- Shingrix (1 of 2) 05/19/2022 (Originally 09/11/1999)  ? INFLUENZA VACCINE  06/06/2022  ? COLONOSCOPY (Pts 45-25yr Insurance coverage will need to be confirmed)  06/29/2023  ? TETANUS/TDAP  10/19/2026  ? Pneumonia Vaccine 73 Years old  Completed  ? Hepatitis C Screening  Completed  ? HPV VACCINES  Aged Out  ? COVID-19 Vaccine  Discontinued  ? ? ? ?See Problem List for Assessment and Plan of chronic medical problems. ? ? ?

## 2022-02-17 ENCOUNTER — Ambulatory Visit (INDEPENDENT_AMBULATORY_CARE_PROVIDER_SITE_OTHER): Payer: PPO | Admitting: Internal Medicine

## 2022-02-17 VITALS — BP 128/82 | HR 89 | Temp 98.6°F | Ht 71.0 in | Wt 204.0 lb

## 2022-02-17 DIAGNOSIS — R7303 Prediabetes: Secondary | ICD-10-CM | POA: Diagnosis not present

## 2022-02-17 DIAGNOSIS — E782 Mixed hyperlipidemia: Secondary | ICD-10-CM | POA: Diagnosis not present

## 2022-02-17 DIAGNOSIS — I2510 Atherosclerotic heart disease of native coronary artery without angina pectoris: Secondary | ICD-10-CM

## 2022-02-17 DIAGNOSIS — Z0000 Encounter for general adult medical examination without abnormal findings: Secondary | ICD-10-CM

## 2022-02-17 LAB — TSH: TSH: 1.63 u[IU]/mL (ref 0.35–5.50)

## 2022-02-17 LAB — LIPID PANEL
Cholesterol: 169 mg/dL (ref 0–200)
HDL: 47.7 mg/dL (ref >39.00)
LDL Cholesterol: 103 mg/dL — ABNORMAL HIGH (ref 0–99)
NonHDL: 120.98
Total CHOL/HDL Ratio: 4
Triglycerides: 88 mg/dL (ref 0.0–149.0)
VLDL: 17.6 mg/dL (ref 0.0–40.0)

## 2022-02-17 LAB — COMPREHENSIVE METABOLIC PANEL
ALT: 26 U/L (ref 0–53)
AST: 27 U/L (ref 0–37)
Albumin: 4.4 g/dL (ref 3.5–5.2)
Alkaline Phosphatase: 55 U/L (ref 39–117)
BUN: 18 mg/dL (ref 6–23)
CO2: 28 mEq/L (ref 19–32)
Calcium: 9.4 mg/dL (ref 8.4–10.5)
Chloride: 100 mEq/L (ref 96–112)
Creatinine, Ser: 1.17 mg/dL (ref 0.40–1.50)
GFR: 62.31 mL/min (ref >60.00)
Glucose, Bld: 91 mg/dL (ref 70–99)
Potassium: 4.1 mEq/L (ref 3.5–5.1)
Sodium: 134 mEq/L — ABNORMAL LOW (ref 135–145)
Total Bilirubin: 1.2 mg/dL (ref 0.2–1.2)
Total Protein: 6.7 g/dL (ref 6.0–8.3)

## 2022-02-17 LAB — CBC WITH DIFFERENTIAL/PLATELET
Basophils Absolute: 0.1 10*3/uL (ref 0.0–0.1)
Basophils Relative: 1.1 % (ref 0.0–3.0)
Eosinophils Absolute: 0.2 10*3/uL (ref 0.0–0.7)
Eosinophils Relative: 3.3 % (ref 0.0–5.0)
HCT: 42.3 % (ref 39.0–52.0)
Hemoglobin: 14.4 g/dL (ref 13.0–17.0)
Lymphocytes Relative: 25.3 % (ref 12.0–46.0)
Lymphs Abs: 1.7 10*3/uL (ref 0.7–4.0)
MCHC: 34.1 g/dL (ref 30.0–36.0)
MCV: 93.7 fl (ref 78.0–100.0)
Monocytes Absolute: 0.5 10*3/uL (ref 0.1–1.0)
Monocytes Relative: 8.2 % (ref 3.0–12.0)
Neutro Abs: 4.2 10*3/uL (ref 1.4–7.7)
Neutrophils Relative %: 62.1 % (ref 43.0–77.0)
Platelets: 206 10*3/uL (ref 150.0–400.0)
RBC: 4.51 Mil/uL (ref 4.22–5.81)
RDW: 13.3 % (ref 11.5–15.5)
WBC: 6.7 10*3/uL (ref 4.0–10.5)

## 2022-02-17 LAB — HEMOGLOBIN A1C: Hgb A1c MFr Bld: 6 % (ref 4.6–6.5)

## 2022-02-17 NOTE — Assessment & Plan Note (Addendum)
Chronic ?Regular exercise and healthy diet encouraged ?Check lipid panel  ?Continue Zetia 10 mg daily ?Declines statin ?

## 2022-02-17 NOTE — Assessment & Plan Note (Signed)
Chronic ?Following with cardiology ?No symptoms consistent with angina ?Aspirin 81 mg daily, Zetia 10 mg daily ?

## 2022-02-17 NOTE — Assessment & Plan Note (Signed)
Chronic Check a1c Low sugar / carb diet Stressed regular exercise  

## 2022-04-04 NOTE — Progress Notes (Unsigned)
No chief complaint on file.   History of Present Illness: 71 yo male with history of CAD, mild carotid artery disease who is here today for cardiac follow up. I saw him as a new consult for the evaluation of chest pain in May 2021. EKG 03/01/20 in primary care with sinus rhythm and no ischemic changes. Carotid artery dopplers September 2019 with mild disease. Cardiac CTA June 2021 with moderate LAD stenosis, mild RCA stenosis. No flow limiting lesions by FFR. Echo May 2021 with LVEF=55-60%, no valve disease.   He is here today for follow up. The patient denies any chest pain, dyspnea, palpitations, lower extremity edema, orthopnea, PND, dizziness, near syncope or syncope.   Primary Care Physician: Binnie Rail, MD  Past Medical History:  Diagnosis Date   Bursitis of elbow    left   Nongonococcal urethritis (NGU) due to other specified organism    Personal history of colonic adenomas 03/18/2013   Rotator cuff injury    Left   Tendinitis of left elbow    Urticaria     Past Surgical History:  Procedure Laterality Date   APPENDECTOMY  1968   COLONOSCOPY     OLECRANON BURSECTOMY Left 10/04/2016   Procedure: LEFT OLECRANON BURSECTOMY;  Surgeon: Leandrew Koyanagi, MD;  Location: [facility];  Service: Orthopedics;  Laterality: Left;   ROTATOR CUFF REPAIR  2008   Arthroscopic Repair ([facility])    Current Outpatient Medications  Medication Sig Dispense Refill   aspirin EC 81 MG tablet Take 81 mg by mouth daily. Swallow whole.     ezetimibe (ZETIA) 10 MG tablet Take 1 tablet (10 mg total) by mouth daily. 90 tablet 3   Flax Oil-Fish Oil-Borage Oil (FISH OIL-FLAX OIL-BORAGE OIL) CAPS Take 2 capsules by mouth 2 (two) times daily.      fluticasone (FLONASE) 50 MCG/ACT nasal spray Place 2 sprays into both nostrils as needed for allergies or rhinitis.     NON FORMULARY Take 1 capsule by mouth 2 (two) times daily. Herbal blend for prostate     vitamin C (ASCORBIC ACID) 500 MG tablet  Take 500 mg by mouth 2 (two) times daily.     Vitamin D, Cholecalciferol, 1000 units CAPS Take 1,000 Units by mouth daily.      vitamin E 100 UNIT capsule Take 100 Units by mouth 2 (two) times daily.      ZINC PICOLINATE PO Take 50 mg by mouth daily.      No current facility-administered medications for this visit.    Allergies  Allergen Reactions   Sulfa Antibiotics Nausea Only    Makes me gag!    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 Married '71. 2 daughters - '74, '77; 2 grandchildren. Marriage in [place]. Retired -but working part-time for [employer]. POA wife. Full resuscitation, short-term mechanical ventilation.      Fun: Work in the yard, fishing at [employer]   Denies religious beliefs effecting health care.          Social Determinants of Health   Financial Resource Strain: Not on file  Food Insecurity: Not on file  Transportation Needs: Not on file  Physical Activity: Not on file  Stress: Not on file  Social Connections: Not on file  Intimate Partner Violence: Not on file    Family History  Problem Relation Age of Onset   Hypertension Mother    Dementia Mother     Prostate cancer Father    Coronary artery disease Father    Cancer Father        lung cancer   Heart disease Father        CAD/CABG   Healthy Maternal Grandmother    Testicular cancer Maternal Grandfather    Healthy Paternal Grandmother    Hiatal hernia Paternal Grandfather    Diabetes Neg Hx    Heart attack Neg Hx    Colon cancer Neg Hx    Hyperlipidemia Neg Hx    Rectal cancer Neg Hx    Stomach cancer Neg Hx     Review of Systems:  As stated in the HPI and otherwise negative.   There were no vitals taken for this visit.  Physical Examination: General: Well developed, well nourished, NAD  HEENT: OP clear, mucus membranes moist  SKIN: warm, dry. No rashes. Neuro: No focal deficits  Musculoskeletal: Muscle strength 5/5 all ext  Psychiatric: Mood and affect normal  Neck: No JVD, no carotid bruits, no thyromegaly, no lymphadenopathy.  Lungs:Clear bilaterally, no wheezes, rhonci, crackles Cardiovascular: Regular rate and rhythm. No murmurs, gallops or rubs. Abdomen:Soft. Bowel sounds present. Non-tender.  Extremities: No lower extremity edema. Pulses are 2 + in the bilateral DP/PT.  EKG:  EKG is *** ordered today. The ekg ordered today demonstrates   Echo 04/02/20: 1. Left ventricular ejection fraction, by estimation, is 55 to 60%. The  left ventricle has normal function. The left ventricle has no regional  wall motion abnormalities. Left ventricular diastolic parameters are  consistent with Grade I diastolic  dysfunction (impaired relaxation). The average left ventricular global  longitudinal strain is -19.0 %. The global longitudinal strain is normal.   2. Right ventricular systolic function is normal. The right ventricular  size is normal.   3. The mitral valve is normal in structure. No evidence of mitral valve  regurgitation. No evidence of mitral stenosis.   4. The aortic valve is normal in structure. Aortic valve regurgitation is  not visualized. No aortic  stenosis is present.   5. The inferior vena cava is normal in size with greater than 50%  respiratory variability, suggesting right atrial pressure of 3 mmHg.   Recent Labs: 02/17/2022: ALT 26; BUN 18; Creatinine, Ser 1.17; Hemoglobin 14.4; Platelets 206.0; Potassium 4.1; Sodium 134; TSH 1.63   Lipid Panel    Component Value Date/Time   CHOL 169 02/17/2022 1044   CHOL 150 08/05/2021 0730   TRIG 88.0 02/17/2022 1044   TRIG 80 09/26/2006 1006  HDL 47.70 02/17/2022 1044   HDL 44 08/05/2021 0730   CHOLHDL 4 02/17/2022 1044   VLDL 17.6 02/17/2022 1044   LDLCALC 103 (H) 02/17/2022 1044   LDLCALC 85 08/05/2021 0730   LDLDIRECT 128.0 02/13/2020 1501     Wt Readings from Last 3 Encounters:  02/17/22 204 lb (92.5 kg)  02/17/21 195 lb 3.2 oz (88.5 kg)  02/10/21 199 lb (90.3 kg)     Assessment and Plan:   1. CAD without angina: Moderate CAD by cardiac CTA May 2021. LV systolic function is normal.  He has no chest pain. We have discussed a statin the past for goal LDL below 70 but he has refused. He remains on zetia and ASA.   2. Hyperlipidemia: LDL not at goal. ***  Current medicines are reviewed at length with the patient today.  The patient does not have concerns regarding medicines.  The following changes have been made:  no change  Labs/ tests ordered today include:   No orders of the defined types were placed in this encounter.    Disposition:   F/U with me in 12 months    Signed, Lauree Chandler, MD 04/04/2022 1:22 PM    [facility] [address], [place], [place]  [address] Phone: [phone]; Fax: [phone]

## 2022-04-05 ENCOUNTER — Encounter: Payer: Self-pay | Admitting: Cardiovascular Disease

## 2022-04-05 ENCOUNTER — Ambulatory Visit: Payer: PPO | Admitting: Cardiovascular Disease

## 2022-04-05 VITALS — BP 118/78 | HR 77 | Ht 71.0 in | Wt 194.6 lb

## 2022-04-05 DIAGNOSIS — E782 Mixed hyperlipidemia: Secondary | ICD-10-CM | POA: Diagnosis not present

## 2022-04-05 DIAGNOSIS — I6523 Occlusion and stenosis of bilateral carotid arteries: Secondary | ICD-10-CM | POA: Diagnosis not present

## 2022-04-05 DIAGNOSIS — I2510 Atherosclerotic heart disease of native coronary artery without angina pectoris: Secondary | ICD-10-CM | POA: Diagnosis not present

## 2022-04-05 LAB — LIPID PANEL
Chol/HDL Ratio: 3.9 ratio (ref 0.0–5.0)
Cholesterol, Total: 166 mg/dL (ref 100–199)
HDL: 43 mg/dL (ref >39)
LDL Chol Calc (NIH): 103 mg/dL — ABNORMAL HIGH (ref 0–99)
Triglycerides: 109 mg/dL (ref 0–149)
VLDL Cholesterol Cal: 20 mg/dL (ref 5–40)

## 2022-04-05 LAB — HEPATIC FUNCTION PANEL
ALT: 25 IU/L (ref 0–44)
AST: 26 IU/L (ref 0–40)
Albumin: 4.4 g/dL (ref 3.7–4.7)
Alkaline Phosphatase: 61 IU/L (ref 44–121)
Bilirubin Total: 0.7 mg/dL (ref 0.0–1.2)
Bilirubin, Direct: 0.18 mg/dL (ref 0.00–0.40)
Total Protein: 6.7 g/dL (ref 6.0–8.5)

## 2022-04-05 NOTE — Patient Instructions (Signed)
Medication Instructions:  Your physician recommends that you continue on your current medications as directed. Please refer to the Current Medication list given to you today.  *If you need a refill on your cardiac medications before your next appointment, please call your pharmacy*   Lab Work: Today:  lipids/liver function  If you have labs (blood work) drawn today and your tests are completely normal, you will receive your results only by: [place] (if you have MyChart) OR A paper copy in the mail If you have any lab test that is abnormal or we need to change your treatment, we will call you to review the results.   Testing/Procedures: Your physician has requested that you have a carotid duplex. This test is an ultrasound of the carotid arteries in your neck. It looks at blood flow through these arteries that supply the brain with blood. Allow one hour for this exam. There are no restrictions or special instructions.  Follow-Up: At [facility], you and your health needs are our priority.  As part of our continuing mission to provide you with exceptional heart care, we have created designated Provider Care Teams.  These Care Teams include your primary Cardiologist (physician) and Advanced Practice Providers (APPs -  Physician Assistants and Nurse Practitioners) who all work together to provide you with the care you need, when you need it.   Your next appointment:   12 month(s)  The format for your next appointment:   In Person  Provider:   [place]

## 2022-04-07 ENCOUNTER — Telehealth: Payer: Self-pay | Admitting: *Deleted

## 2022-04-07 ENCOUNTER — Ambulatory Visit (INDEPENDENT_AMBULATORY_CARE_PROVIDER_SITE_OTHER): Payer: PPO

## 2022-04-07 DIAGNOSIS — Z0000 Encounter for general adult medical examination without abnormal findings: Secondary | ICD-10-CM

## 2022-04-07 DIAGNOSIS — Z79899 Other long term (current) drug therapy: Secondary | ICD-10-CM

## 2022-04-07 MED ORDER — ROSUVASTATIN CALCIUM 10 MG PO TABS: 10.0000 mg | ORAL_TABLET | Freq: Every day | ORAL | 3 refills | Status: DC

## 2022-04-07 NOTE — Patient Instructions (Signed)
Richard Novak , Thank you for taking time to come for your Medicare Wellness Visit. I appreciate your ongoing commitment to your health goals. Please review the following plan we discussed and let me know if I can assist you in the future.   Screening recommendations/referrals: Colonoscopy: 06/28/2018 Recommended yearly ophthalmology/optometry visit for glaucoma screening and checkup Recommended yearly dental visit for hygiene and checkup  Vaccinations: Influenza vaccine: declined  Pneumococcal vaccine: completed  Tdap vaccine: 10/19/2016 Shingles vaccine: will consider     Advanced directives: yes   Conditions/risks identified: none   Next appointment: none   Preventive Care 27 Years and Older, Male Preventive care refers to lifestyle choices and visits with your health care provider that can promote health and wellness. What does preventive care include? A yearly physical exam. This is also called an annual well check. Dental exams once or twice a year. Routine eye exams. Ask your health care provider how often you should have your eyes checked. Personal lifestyle choices, including: Daily care of your teeth and gums. Regular physical activity. Eating a healthy diet. Avoiding tobacco and drug use. Limiting alcohol use. Practicing safe sex. Taking low doses of aspirin every day. Taking vitamin and mineral supplements as recommended by your health care provider. What happens during an annual well check? The services and screenings done by your health care provider during your annual well check will depend on your age, overall health, lifestyle risk factors, and family history of disease. Counseling  Your health care provider may ask you questions about your: Alcohol use. Tobacco use. Drug use. Emotional well-being. Home and relationship well-being. Sexual activity. Eating habits. History of falls. Memory and ability to understand (cognition). Work and work  Statistician. Screening  You may have the following tests or measurements: Height, weight, and BMI. Blood pressure. Lipid and cholesterol levels. These may be checked every 5 years, or more frequently if you are over 27 years old. Skin check. Lung cancer screening. You may have this screening every year starting at age 87 if you have a 30-pack-year history of smoking and currently smoke or have quit within the past 15 years. Fecal occult blood test (FOBT) of the stool. You may have this test every year starting at age 24. Flexible sigmoidoscopy or colonoscopy. You may have a sigmoidoscopy every 5 years or a colonoscopy every 10 years starting at age 57. Prostate cancer screening. Recommendations will vary depending on your family history and other risks. Hepatitis C blood test. Hepatitis B blood test. Sexually transmitted disease (STD) testing. Diabetes screening. This is done by checking your blood sugar (glucose) after you have not eaten for a while (fasting). You may have this done every 1-3 years. Abdominal aortic aneurysm (AAA) screening. You may need this if you are a current or former smoker. Osteoporosis. You may be screened starting at age 84 if you are at high risk. Talk with your health care provider about your test results, treatment options, and if necessary, the need for more tests. Vaccines  Your health care provider may recommend certain vaccines, such as: Influenza vaccine. This is recommended every year. Tetanus, diphtheria, and acellular pertussis (Tdap, Td) vaccine. You may need a Td booster every 10 years. Zoster vaccine. You may need this after age 47. Pneumococcal 13-valent conjugate (PCV13) vaccine. One dose is recommended after age 57. Pneumococcal polysaccharide (PPSV23) vaccine. One dose is recommended after age 97. Talk to your health care provider about which screenings and vaccines you need and how often you  need them. This information is not intended to replace  advice given to you by your health care provider. Make sure you discuss any questions you have with your health care provider. Document Released: 11/19/2015 Document Revised: 07/12/2016 Document Reviewed: 08/24/2015 Elsevier Interactive Patient Education  2017 [place] Prevention in the Home Falls can cause injuries. They can happen to people of all ages. There are many things you can do to make your home safe and to help prevent falls. What can I do on the outside of my home? Regularly fix the edges of walkways and driveways and fix any cracks. Remove anything that might make you trip as you walk through a door, such as a raised step or threshold. Trim any bushes or trees on the path to your home. Use bright outdoor lighting. Clear any walking paths of anything that might make someone trip, such as rocks or tools. Regularly check to see if handrails are loose or broken. Make sure that both sides of any steps have handrails. Any raised decks and porches should have guardrails on the edges. Have any leaves, snow, or ice cleared regularly. Use sand or salt on walking paths during winter. Clean up any spills in your garage right away. This includes oil or grease spills. What can I do in the bathroom? Use night lights. Install grab bars by the toilet and in the tub and shower. Do not use towel bars as grab bars. Use non-skid mats or decals in the tub or shower. If you need to sit down in the shower, use a plastic, non-slip stool. Keep the floor dry. Clean up any water that spills on the floor as soon as it happens. Remove soap buildup in the tub or shower regularly. Attach bath mats securely with double-sided non-slip rug tape. Do not have throw rugs and other things on the floor that can make you trip. What can I do in the bedroom? Use night lights. Make sure that you have a light by your bed that is easy to reach. Do not use any sheets or blankets that are too big for your bed.  They should not hang down onto the floor. Have a firm chair that has side arms. You can use this for support while you get dressed. Do not have throw rugs and other things on the floor that can make you trip. What can I do in the kitchen? Clean up any spills right away. Avoid walking on wet floors. Keep items that you use a lot in easy-to-reach places. If you need to reach something above you, use a strong step stool that has a grab bar. Keep electrical cords out of the way. Do not use floor polish or wax that makes floors slippery. If you must use wax, use non-skid floor wax. Do not have throw rugs and other things on the floor that can make you trip. What can I do with my stairs? Do not leave any items on the stairs. Make sure that there are handrails on both sides of the stairs and use them. Fix handrails that are broken or loose. Make sure that handrails are as long as the stairways. Check any carpeting to make sure that it is firmly attached to the stairs. Fix any carpet that is loose or worn. Avoid having throw rugs at the top or bottom of the stairs. If you do have throw rugs, attach them to the floor with carpet tape. Make sure that you have a light switch  at the top of the stairs and the bottom of the stairs. If you do not have them, ask someone to add them for you. What else can I do to help prevent falls? Wear shoes that: Do not have high heels. Have rubber bottoms. Are comfortable and fit you well. Are closed at the toe. Do not wear sandals. If you use a stepladder: Make sure that it is fully opened. Do not climb a closed stepladder. Make sure that both sides of the stepladder are locked into place. Ask someone to hold it for you, if possible. Clearly mark and make sure that you can see: Any grab bars or handrails. First and last steps. Where the edge of each step is. Use tools that help you move around (mobility aids) if they are needed. These  include: Canes. Walkers. Scooters. Crutches. Turn on the lights when you go into a dark area. Replace any light bulbs as soon as they burn out. Set up your furniture so you have a clear path. Avoid moving your furniture around. If any of your floors are uneven, fix them. If there are any pets around you, be aware of where they are. Review your medicines with your doctor. Some medicines can make you feel dizzy. This can increase your chance of falling. Ask your doctor what other things that you can do to help prevent falls. This information is not intended to replace advice given to you by your health care provider. Make sure you discuss any questions you have with your health care provider. Document Released: 08/19/2009 Document Revised: 03/30/2016 Document Reviewed: 11/27/2014 Elsevier Interactive Patient Education  2017 [employer].

## 2022-04-07 NOTE — Progress Notes (Signed)
Subjective:   Richard Novak is a 71 y.o. male who presents for an Subsequent Medicare Annual Wellness Visit.   I connected with Richard Novak today by telephone and verified that I am speaking with the correct person using two identifiers. Location patient: home Location provider: work Persons participating in the virtual visit: patient, provider.   I discussed the limitations, risks, security and privacy concerns of performing an evaluation and management service by telephone and the availability of in person appointments. I also discussed with the patient that there may be a patient responsible charge related to this service. The patient expressed understanding and verbally consented to this telephonic visit.    Interactive audio and video telecommunications were attempted between this provider and patient, however failed, due to patient having technical difficulties OR patient did not have access to video capability.  We continued and completed visit with audio only.    Review of Systems     Cardiac Risk Factors include: advanced age (>57mn, >>6women);male gender     Objective:    Today's Vitals   There is no height or weight on file to calculate BMI.     04/07/2022    9:45 AM 02/17/2021    8:43 AM 07/25/2018    8:41 AM 10/04/2016    7:16 AM 10/03/2016   10:19 AM  Advanced Directives  Does Patient Have a Medical Advance Directive? Yes Yes Yes Yes Yes  Type of [place]amed[place] will Living will;Healthcare Power of [place] Living will Living will  Does patient want to make changes to medical advance directive?  No - Patient declined  No - Patient declined   Copy of [place] Chart? No - copy requested No - copy requested No - copy requested      Current Medications (verified) Outpatient Encounter Medications as of 04/07/2022  Medication Sig   aspirin EC 81 MG tablet Take 81 mg  by mouth daily. Swallow whole.   ezetimibe (ZETIA) 10 MG tablet Take 1 tablet (10 mg total) by mouth daily.   Flax Oil-Fish Oil-Borage Oil (FISH OIL-FLAX OIL-BORAGE OIL) CAPS Take 2 capsules by mouth 2 (two) times daily.    fluticasone (FLONASE) 50 MCG/ACT nasal spray Place 2 sprays into both nostrils as needed for allergies or rhinitis.   NON FORMULARY Take 1 capsule by mouth 2 (two) times daily. Herbal blend for prostate   vitamin C (ASCORBIC ACID) 500 MG tablet Take 500 mg by mouth 2 (two) times daily.   Vitamin D, Cholecalciferol, 1000 units CAPS Take 1,000 Units by mouth daily.    vitamin E 100 UNIT capsule Take 100 Units by mouth 2 (two) times daily.    ZINC PICOLINATE PO Take 50 mg by mouth daily.    No facility-administered encounter medications on file as of 04/07/2022.    Allergies (verified) Sulfa antibiotics   History: Past Medical History:  Diagnosis Date   Bursitis of elbow    left   Nongonococcal urethritis (NGU) due to other specified organism    Personal history of colonic adenomas 03/18/2013   Rotator cuff injury    Left   Tendinitis of left elbow    Urticaria    Past Surgical History:  Procedure Laterality Date   APPENDECTOMY  1968   COLONOSCOPY     OLECRANON BURSECTOMY Left 10/04/2016   Procedure: LEFT OLECRANON BURSECTOMY;  Surgeon: NLeandrew Koyanagi MD;  Location: [facility];  Service: Orthopedics;  Laterality: Left;   ROTATOR CUFF REPAIR  2008   Arthroscopic Repair ([facility])   Family History  Problem Relation Age of Onset   Hypertension Mother    Dementia Mother    Prostate cancer Father    Coronary artery disease Father    Cancer Father        lung cancer   Heart disease Father        CAD/CABG   Healthy Maternal Grandmother    Testicular cancer Maternal Grandfather    Healthy Paternal Grandmother    Hiatal hernia Paternal Grandfather    Diabetes Neg Hx    Heart attack Neg Hx    Colon cancer Neg Hx    Hyperlipidemia Neg Hx    Rectal  cancer Neg Hx    Stomach cancer Neg Hx    Social History   Socioeconomic History   Marital status: Married    Spouse name: Not on file   Number of children: 2   Years of education: 14   Highest education level: Not on file  Occupational History   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 Married '71. 2 daughters - '74, '77; 2 grandchildren. Marriage in [place]. Retired -but working part-time for [employer]. POA wife. Full resuscitation, short-term mechanical ventilation.      Fun: Work in the yard, fishing at [employer]   Denies religious beliefs effecting health care.          Social Determinants of Health   Financial Resource Strain: Low Risk    Difficulty of Paying Living Expenses: Not hard at all  Food Insecurity: No Food Insecurity   Worried About Charity fundraiser in the Last Year: Never true   [place] in the Last Year: Never true  Transportation Needs: No Transportation Needs   Lack of Transportation (Medical): No   Lack of Transportation (Non-Medical): No  Physical Activity: Sufficiently Active   Days of Exercise per Week: 6 days   Minutes of Exercise per Session: 30 min  Stress: No Stress Concern Present   Feeling of Stress : Not at all  Social Connections: Moderately Integrated   Frequency of Communication with Friends and Family: Three times a week   Frequency of  Social Gatherings with Friends and Family: Three times a week   Attends Religious Services: More than 4 times per year   Active Member of Clubs or Organizations: No   Attends Archivist Meetings: Never   Marital Status: Married    Tobacco Counseling Counseling given: Not Answered   Clinical Intake:  Pre-visit preparation completed: Yes  Pain : No/denies pain     Nutritional Risks: None Diabetes: No  How often do you  need to have someone help you when you read instructions, pamphlets, or other written materials from your doctor or pharmacy?: 1 - Never What is the last grade level you completed in school?: college  Diabetic?no   Interpreter Needed?: No  Information entered by :: [place] of Daily Living    04/07/2022    9:49 AM  In your present state of health, do you have any difficulty performing the following activities:  Hearing? 0  Vision? 0  Difficulty concentrating or making decisions? 0  Walking or climbing stairs? 0  Dressing or bathing? 0  Doing errands, shopping? 0  Preparing Food and eating ? N  Using the Toilet? N  In the past six months, have you accidently leaked urine? N  Do you have problems with loss of bowel control? N  Managing your Medications? N  Managing your Finances? N  Housekeeping or managing your Housekeeping? N    Patient Care Team: Binnie Rail, MD as PCP - General (Internal Medicine) Burnell Blanks, MD as PCP - Cardiology (Cardiology) Gatha Mayer, MD as Consulting Physician (Gastroenterology)  Indicate any recent Medical Services you may have received from other than [facility] providers in the past year (date may be approximate).     Assessment:   This is a routine wellness examination for Richard Novak.  Hearing/Vision screen Vision Screening - Comments:: Declined eye exam   Dietary issues and exercise activities discussed: Current Exercise Habits: Home exercise routine, Type of exercise:  walking, Time (Minutes): 30, Frequency (Times/Week): 5, Weekly Exercise (Minutes/Week): 150, Exercise limited by: None identified   Goals Addressed   None    Depression Screen    04/07/2022    9:47 AM 04/07/2022    9:44 AM 02/17/2022   10:00 AM 02/17/2021    8:42 AM 02/13/2020    1:56 PM 07/25/2018    8:19 AM 10/19/2016    9:06 AM  PHQ 2/9 Scores  PHQ - 2 Score 0 0 0 0 0 0 0    Fall Risk    04/07/2022    9:46 AM 02/17/2022   10:00 AM 02/17/2022    9:59 AM 02/17/2021    8:43 AM 02/13/2020    1:56 PM  Fall Risk   Falls in the past year? 0 0  0 0  Number falls in past yr: 0 0 0 0 0  Injury with Fall? 0 0 0 0 0  Risk for fall due to :  No Fall Risks No Fall Risks No Fall Risks No Fall Risks  Follow up Falls evaluation completed Falls evaluation completed Falls evaluation completed Falls evaluation completed Falls evaluation completed    [place]:  Any stairs in or around the home? No  If so, are there any without handrails? No  Home free of loose throw rugs in walkways, pet beds, electrical cords, etc? Yes  Adequate lighting in your home to reduce risk of falls? Yes   ASSISTIVE DEVICES UTILIZED TO PREVENT FALLS:  Life alert? No  Use of a cane, walker or w/c? No  Grab bars in the bathroom? No  Shower chair or bench in shower? No  Elevated toilet seat or a handicapped toilet? No     Cognitive Function:  Normal cognitive status assessed by telephone conversation by this Nurse Health Advisor. No abnormalities found.        Immunizations Immunization History  Administered Date(s) Administered   Pneumococcal Conjugate-13 06/09/2015   Pneumococcal  Polysaccharide-23 10/19/2016   Tdap 10/19/2016    TDAP status: Up to date  Flu Vaccine status: Declined, Education has been provided regarding the importance of this vaccine but patient still declined. Advised may receive this vaccine at local pharmacy or Health Dept. Aware to provide a copy of the  vaccination record if obtained from local pharmacy or Health Dept. Verbalized acceptance and understanding.  Pneumococcal vaccine status: Up to date  Covid-19 vaccine status: Completed vaccines  Qualifies for Shingles Vaccine? Yes   Zostavax completed No   Shingrix Completed?: No.    Education has been provided regarding the importance of this vaccine. Patient has been advised to call insurance company to determine out of pocket expense if they have not yet received this vaccine. Advised may also receive vaccine at local pharmacy or Health Dept. Verbalized acceptance and understanding.  Screening Tests Health Maintenance  Topic Date Due   Zoster Vaccines- Shingrix (1 of 2) 05/19/2022 (Originally 09/11/1999)   INFLUENZA VACCINE  06/06/2022   COLONOSCOPY (Pts 45-51yr Insurance coverage will need to be confirmed)  06/29/2023   TETANUS/TDAP  10/19/2026   Pneumonia Vaccine 71 Years old  Completed   Hepatitis C Screening  Completed   HPV VACCINES  Aged Out   COVID-19 Vaccine  Discontinued    Health Maintenance  There are no preventive care reminders to display for this patient.  Colorectal cancer screening: Type of screening: Colonoscopy. Completed 06/28/2018. Repeat every 5 years  Lung Cancer Screening: (Low Dose CT Chest recommended if Age 593-80years, 30 pack-year currently smoking OR have quit w/in 15years.) does not qualify.   Lung Cancer Screening Referral: n/a  Additional Screening:  Hepatitis C Screening: does not qualify;   Vision Screening: Recommended annual ophthalmology exams for early detection of glaucoma and other disorders of the eye. Is the patient up to date with their annual eye exam?  No  Who is the provider or what is the name of the office in which the patient attends annual eye exams? Patient declines  If pt is not established with a provider, would they like to be referred to a provider to establish care? No .   Dental Screening: Recommended annual dental  exams for proper oral hygiene  Community Resource Referral / Chronic Care Management: CRR required this visit?  No   CCM required this visit?  No      Plan:     I have personally reviewed and noted the following in the patient's chart:   Medical and social history Use of alcohol, tobacco or illicit drugs  Current medications and supplements including opioid prescriptions. Patient is not currently taking opioid prescriptions. Functional ability and status Nutritional status Physical activity Advanced directives List of other physicians Hospitalizations, surgeries, and ER visits in previous 12 months Vitals Screenings to include cognitive, depression, and falls Referrals and appointments  In addition, I have reviewed and discussed with patient certain preventive protocols, quality metrics, and best practice recommendations. A written personalized care plan for preventive services as well as general preventive health recommendations were provided to patient.     LRandel Pigg LPN   [id number]  Nurse Notes: none

## 2022-04-07 NOTE — Telephone Encounter (Signed)
-----   Message from Burnell Blanks, MD sent at 04/06/2022  7:25 AM EDT ----- He was trying diet along with his Zetia to lower his LDL but not at goal. Can we see if he is willing to start Crestor 10 mg daily? Repeat lipids and LFTS in 12 weeks. He and I talked about this yesterday. cdm

## 2022-04-14 ENCOUNTER — Ambulatory Visit (HOSPITAL_COMMUNITY)
Admission: RE | Admit: 2022-04-14 | Discharge: 2022-04-14 | Disposition: A | Discharge status: Home / Self Care | Payer: PPO | Source: Ambulatory Visit | Attending: Cardiovascular Disease | Admitting: Cardiovascular Disease

## 2022-04-14 ENCOUNTER — Other Ambulatory Visit: Payer: Self-pay | Admitting: Cardiovascular Disease

## 2022-04-14 DIAGNOSIS — I6523 Occlusion and stenosis of bilateral carotid arteries: Secondary | ICD-10-CM | POA: Insufficient documentation

## 2022-04-14 DIAGNOSIS — E782 Mixed hyperlipidemia: Secondary | ICD-10-CM | POA: Insufficient documentation

## 2022-04-14 DIAGNOSIS — I2510 Atherosclerotic heart disease of native coronary artery without angina pectoris: Secondary | ICD-10-CM | POA: Insufficient documentation

## 2022-05-05 DIAGNOSIS — Z125 Encounter for screening for malignant neoplasm of prostate: Secondary | ICD-10-CM | POA: Diagnosis not present

## 2022-05-27 ENCOUNTER — Other Ambulatory Visit: Payer: Self-pay | Admitting: Cardiovascular Disease

## 2022-07-07 ENCOUNTER — Ambulatory Visit: Payer: PPO | Attending: Cardiology

## 2022-07-07 DIAGNOSIS — Z79899 Other long term (current) drug therapy: Secondary | ICD-10-CM | POA: Diagnosis not present

## 2022-07-07 DIAGNOSIS — N528 Other male erectile dysfunction: Secondary | ICD-10-CM | POA: Diagnosis not present

## 2022-07-07 DIAGNOSIS — Z125 Encounter for screening for malignant neoplasm of prostate: Secondary | ICD-10-CM | POA: Diagnosis not present

## 2022-07-07 LAB — HEPATIC FUNCTION PANEL
ALT: 61 IU/L — ABNORMAL HIGH (ref 0–44)
AST: 49 IU/L — ABNORMAL HIGH (ref 0–40)
Albumin: 4.4 g/dL (ref 3.8–4.8)
Alkaline Phosphatase: 61 IU/L (ref 44–121)
Bilirubin Total: 0.6 mg/dL (ref 0.0–1.2)
Bilirubin, Direct: 0.19 mg/dL (ref 0.00–0.40)
Total Protein: 6.7 g/dL (ref 6.0–8.5)

## 2022-07-07 LAB — LIPID PANEL
Chol/HDL Ratio: 2.2 ratio (ref 0.0–5.0)
Cholesterol, Total: 101 mg/dL (ref 100–199)
HDL: 46 mg/dL (ref >39)
LDL Chol Calc (NIH): 41 mg/dL (ref 0–99)
Triglycerides: 65 mg/dL (ref 0–149)
VLDL Cholesterol Cal: 14 mg/dL (ref 5–40)

## 2022-07-14 ENCOUNTER — Ambulatory Visit (INDEPENDENT_AMBULATORY_CARE_PROVIDER_SITE_OTHER): Payer: PPO

## 2022-07-14 VITALS — Ht 71.0 in | Wt 195.0 lb

## 2022-07-14 DIAGNOSIS — Z0000 Encounter for general adult medical examination without abnormal findings: Secondary | ICD-10-CM | POA: Diagnosis not present

## 2022-07-14 NOTE — Progress Notes (Signed)
Subjective:   Richard Novak is a 71 y.o. male who presents for Medicare Annual/Subsequent preventive examination.   Virtual Visit via Telephone Note  I connected with  Richard Novak on 07/14/22 at  9:30 AM EDT by telephone and verified that I am speaking with the correct person using two identifiers.  Location: Patient: home  Provider: Paulla Fore  Persons participating in the virtual visit: patient/Nurse Health Advisor   I discussed the limitations, risks, security and privacy concerns of performing an evaluation and management service by telephone and the availability of in person appointments. The patient expressed understanding and agreed to proceed.  Interactive audio and video telecommunications were attempted between this nurse and patient, however failed, due to patient having technical difficulties OR patient did not have access to video capability.  We continued and completed visit with audio only.  Some vital signs may be absent or patient reported.   Daphane Shepherd, LPN  Review of Systems     Cardiac Risk Factors include: advanced age (>6mn, >>18women)     Objective:    Today's Vitals   07/14/22 0937  Weight: 195 lb (88.5 kg)  Height: '5\' 11"'$  (1.803 m)   Body mass index is 27.2 kg/m.     07/14/2022    9:42 AM 04/07/2022    9:45 AM 02/17/2021    8:43 AM 07/25/2018    8:41 AM 10/04/2016    7:16 AM 10/03/2016   10:19 AM  Advanced Directives  Does Patient Have a Medical Advance Directive? Yes Yes Yes Yes Yes Yes  Type of AParamedicof AMiltonLiving will HPunta RassaLiving will Living will;Healthcare Power of AGlascockLiving will Living will Living will  Does patient want to make changes to medical advance directive?   No - Patient declined  No - Patient declined   Copy of [place] Chart? No - copy requested No - copy requested No - copy requested No - copy requested       Current Medications (verified) Outpatient Encounter Medications as of 07/14/2022  Medication Sig   aspirin EC 81 MG tablet Take 81 mg by mouth daily. Swallow whole.   ezetimibe (ZETIA) 10 MG tablet TAKE 1 TABLET BY MOUTH EVERY DAY   Flax Oil-Fish Oil-Borage Oil (FISH OIL-FLAX OIL-BORAGE OIL) CAPS Take 2 capsules by mouth 2 (two) times daily.    fluticasone (FLONASE) 50 MCG/ACT nasal spray Place 2 sprays into both nostrils as needed for allergies or rhinitis.   NON FORMULARY Take 1 capsule by mouth 2 (two) times daily. Herbal blend for prostate   rosuvastatin (CRESTOR) 10 MG tablet Take 1 tablet (10 mg total) by mouth daily.   vitamin C (ASCORBIC ACID) 500 MG tablet Take 500 mg by mouth 2 (two) times daily.   Vitamin D, Cholecalciferol, 1000 units CAPS Take 1,000 Units by mouth daily.    vitamin E 100 UNIT capsule Take 100 Units by mouth 2 (two) times daily.    ZINC PICOLINATE PO Take 50 mg by mouth daily.    No facility-administered encounter medications on file as of 07/14/2022.    Allergies (verified) Sulfa antibiotics   History: Past Medical History:  Diagnosis Date   Bursitis of elbow    left   Nongonococcal urethritis (NGU) due to other specified organism    Personal history of colonic adenomas 03/18/2013   Rotator cuff injury    Left   Tendinitis of left elbow  Urticaria    Past Surgical History:  Procedure Laterality Date   APPENDECTOMY  1968   COLONOSCOPY     OLECRANON BURSECTOMY Left 10/04/2016   Procedure: LEFT OLECRANON BURSECTOMY;  Surgeon: Leandrew Koyanagi, MD;  Location: [place];  Service: Orthopedics;  Laterality: Left;   ROTATOR CUFF REPAIR  2008   Arthroscopic Repair ([facility])   Family History  Problem Relation Age of Onset   Hypertension Mother    Dementia Mother    Prostate cancer Father    Coronary artery disease Father    Cancer Father        lung cancer   Heart disease Father        CAD/CABG   Healthy Maternal Grandmother     Testicular cancer Maternal Grandfather    Healthy Paternal Grandmother    Hiatal hernia Paternal Grandfather    Diabetes Neg Hx    Heart attack Neg Hx    Colon cancer Neg Hx    Hyperlipidemia Neg Hx    Rectal cancer Neg Hx    Stomach cancer Neg Hx    Social History   Socioeconomic History   Marital status: Married    Spouse name: Not on file   Number of children: 2   Years of education: 14   Highest education level: Not on file  Occupational History   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 Married '71. 2 daughters - '74, '77; 2 grandchildren. Marriage in [place]. Retired -but working part-time for [employer]. POA wife. Full resuscitation, short-term mechanical ventilation.      Fun: Work in the yard, fishing at [employer]   Denies religious beliefs effecting health care.          Social Determinants of Health   Financial Resource Strain: Low Risk  (07/14/2022)   Overall Financial Resource Strain (CARDIA)    Difficulty of Paying Living Expenses: Not hard at all  Food Insecurity: No Food Insecurity (07/14/2022)   Hunger Vital Sign    Worried About Running Out of Food in the Last Year: Never true    Ran Out of Food in the Last Year: Never true  Transportation Needs: No Transportation Needs (07/14/2022)   PRAPARE - Hydrologist (Medical): No    Lack of  Transportation (Non-Medical): No  Physical Activity: Insufficiently Active (07/14/2022)   Exercise Vital Sign    Days of Exercise per Week: 3 days    Minutes of Exercise per Session: 30 min  Stress: No Stress Concern Present (07/14/2022)   [place]    Feeling of Stress : Not at all  Social Connections: Moderately Integrated (07/14/2022)   Social Connection and  Isolation Panel [NHANES]    Frequency of Communication with Friends and Family: More than three times a week    Frequency of Social Gatherings with Friends and Family: More than three times a week    Attends Religious Services: More than 4 times per year    Active Member of [employer] or Organizations: No    Attends Music therapist: Never    Marital Status: Married    Tobacco Counseling Counseling given: Not Answered   Clinical Intake:  Pre-visit preparation completed: Yes  Pain : No/denies pain     Nutritional Risks: None Diabetes: No  How often do you need to have someone help you when you read instructions, pamphlets, or other written materials from your doctor or pharmacy?: 1 - Never  Diabetic?no  Interpreter Needed?: No  Information entered by :: Jadene Pierini , LPN   Activities of Daily Living    07/14/2022    9:42 AM 07/10/2022    3:13 PM  In your present state of health, do you have any difficulty performing the following activities:  Hearing? 0 0  Vision? 0 0  Difficulty concentrating or making decisions? 0 0  Walking or climbing stairs? 0 0  Dressing or bathing? 0 0  Doing errands, shopping? 0 0  Preparing Food and eating ? N N  Using the Toilet? N N  In the past six months, have you accidently leaked urine? N N  Do you have problems with loss of bowel control? N N  Managing your Medications? N N  Managing your Finances? N N  Housekeeping or managing your Housekeeping? N N    Patient Care Team: Binnie Rail, MD as PCP -  General (Internal Medicine) Burnell Blanks, MD as PCP - Cardiology (Cardiology) Gatha Mayer, MD as Consulting Physician (Gastroenterology)  Indicate any recent Medical Services you may have received from other than [facility] providers in the past year (date may be approximate).     Assessment:   This is a routine wellness examination for Richard Novak.  Hearing/Vision screen Vision Screening - Comments:: Due annual eye exam Dr.Hecker wear glasses   Dietary issues and exercise activities discussed:     Goals Addressed             This Visit's Progress    Patient Stated   On track    Stay as healthy and as independent as possible. Enjoy life and travel to [place] with family.       Depression Screen    07/14/2022    9:41 AM 04/07/2022    9:47 AM 04/07/2022    9:44 AM 02/17/2022   10:00 AM 02/17/2021    8:42 AM 02/13/2020    1:56 PM 07/25/2018    8:19 AM  PHQ 2/9 Scores  PHQ - 2 Score 0 0 0 0 0 0 0    Fall Risk    07/14/2022    9:39 AM 07/10/2022    3:13 PM 04/07/2022    9:46 AM 02/17/2022   10:00 AM 02/17/2022    9:59 AM  Fall Risk   Falls in the past year? 0 0 0 0   Number falls in past yr: 0  0 0 0  Injury with Fall? 0  0 0 0  Risk for fall due to : No Fall Risks   No Fall Risks No Fall Risks  Follow up Falls prevention discussed  Falls evaluation completed Falls evaluation completed Falls evaluation completed    FALL  RISK PREVENTION PERTAINING TO THE HOME:  Any stairs in or around the home? No  If so, are there any without handrails? No  Home free of loose throw rugs in walkways, pet beds, electrical cords, etc? Yes  Adequate lighting in your home to reduce risk of falls? Yes   ASSISTIVE DEVICES UTILIZED TO PREVENT FALLS:  Life alert? No  Use of a cane, walker or w/c? No  Grab bars in the bathroom? No  Shower chair or bench in shower? No  Elevated toilet seat or a handicapped toilet? No        07/14/2022    9:42 AM  6CIT Screen  What Year? 0 points  What  month? 0 points  What time? 0 points  Count back from 20 0 points  Months in reverse 0 points  Repeat phrase 0 points  Total Score 0 points    Immunizations Immunization History  Administered Date(s) Administered   Pneumococcal Conjugate-13 06/09/2015   Pneumococcal Polysaccharide-23 10/19/2016   Tdap 10/19/2016    TDAP status: Up to date  Flu Vaccine status: Up to date  Pneumococcal vaccine status: Up to date  Covid-19 vaccine status: Completed vaccines  Qualifies for Shingles Vaccine? Yes   Zostavax completed No   Shingrix Completed?: No.    Education has been provided regarding the importance of this vaccine. Patient has been advised to call insurance company to determine out of pocket expense if they have not yet received this vaccine. Advised may also receive vaccine at local pharmacy or Health Dept. Verbalized acceptance and understanding.  Screening Tests Health Maintenance  Topic Date Due   Zoster Vaccines- Shingrix (1 of 2) Never done   INFLUENZA VACCINE  Never done   COLONOSCOPY (Pts 45-31yr Insurance coverage will need to be confirmed)  06/29/2023   TETANUS/TDAP  10/19/2026   Pneumonia Vaccine 71 Years old  Completed   Hepatitis C Screening  Completed   HPV VACCINES  Aged Out   COVID-19 Vaccine  Discontinued    Health Maintenance  Health Maintenance Due  Topic Date Due   Zoster Vaccines- Shingrix (1 of 2) Never done   INFLUENZA VACCINE  Never done    Colorectal cancer screening: Type of screening: Colonoscopy. Completed 06/28/2018. Repeat every 5 years  Lung Cancer Screening: (Low Dose CT Chest recommended if Age 211-80years, 30 pack-year currently smoking OR have quit w/in 15years.) does not qualify.   Lung Cancer Screening Referral: n/  Additional Screening:  Hepatitis C Screening: does not qualify; Completed 10/19/2016  Vision Screening: Recommended annual ophthalmology exams for early detection of glaucoma and other disorders of the eye. Is  the patient up to date with their annual eye exam?  No  Who is the provider or what is the name of the office in which the patient attends annual eye exams? Dr.Hecker patient will call to schedule appointment  If pt is not established with a provider, would they like to be referred to a provider to establish care? No .   Dental Screening: Recommended annual dental exams for proper oral hygiene  Community Resource Referral / Chronic Care Management: CRR required this visit?  No   CCM required this visit?  No      Plan:     I have personally reviewed and noted the following in the patient's chart:   Medical and social history Use of alcohol, tobacco or illicit drugs  Current medications and supplements including opioid prescriptions. Patient is not currently taking opioid prescriptions.  Functional ability and status Nutritional status Physical activity Advanced directives List of other physicians Hospitalizations, surgeries, and ER visits in previous 12 months Vitals Screenings to include cognitive, depression, and falls Referrals and appointments  In addition, I have reviewed and discussed with patient certain preventive protocols, quality metrics, and best practice recommendations. A written personalized care plan for preventive services as well as general preventive health recommendations were provided to patient.     Daphane Shepherd, LPN   05/08/1827   Nurse Notes: Patient to call schedule annual eye exam established with Dr. Herbert Deaner

## 2022-07-14 NOTE — Patient Instructions (Signed)
[name] , Thank you for taking time to come for your Medicare Wellness Visit. I appreciate your ongoing commitment to your health goals. Please review the following plan we discussed and let me know if I can assist you in the future.   Screening recommendations/referrals: Colonoscopy: 06/28/2018  due 06/2023 Recommended yearly ophthalmology/optometry visit for glaucoma screening and checkup Recommended yearly dental visit for hygiene and checkup  Vaccinations: Influenza vaccine: due in the fall  Pneumococcal vaccine: completed  Tdap vaccine: 10/19/2016 Shingles vaccine: completed    Covid-19: completed   Advanced directives: Please bring a copy of your health care power of attorney and living will to the office to be added to your chart at your convenience.   Conditions/risks identified: Aim for 30 minutes of exercise or brisk walking, 6-8 glasses of water, and 5 servings of fruits and vegetables each day.   Next appointment: Follow up in one year for your annual wellness visit.   Preventive Care 53 Years and Older, Male  Preventive care refers to lifestyle choices and visits with your health care provider that can promote health and wellness. What does preventive care include? A yearly physical exam. This is also called an annual well check. Dental exams once or twice a year. Routine eye exams. Ask your health care provider how often you should have your eyes checked. Personal lifestyle choices, including: Daily care of your teeth and gums. Regular physical activity. Eating a healthy diet. Avoiding tobacco and drug use. Limiting alcohol use. Practicing safe sex. Taking low doses of aspirin every day. Taking vitamin and mineral supplements as recommended by your health care provider. What happens during an annual well check? The services and screenings done by your health care provider during your annual well check will depend on your age, overall health, lifestyle risk  factors, and family history of disease. Counseling  Your health care provider may ask you questions about your: Alcohol use. Tobacco use. Drug use. Emotional well-being. Home and relationship well-being. Sexual activity. Eating habits. History of falls. Memory and ability to understand (cognition). Work and work Statistician. Screening  You may have the following tests or measurements: Height, weight, and BMI. Blood pressure. Lipid and cholesterol levels. These may be checked every 5 years, or more frequently if you are over 7 years old. Skin check. Lung cancer screening. You may have this screening every year starting at age 73 if you have a 30-pack-year history of smoking and currently smoke or have quit within the past 15 years. Fecal occult blood test (FOBT) of the stool. You may have this test every year starting at age 73. Flexible sigmoidoscopy or colonoscopy. You may have a sigmoidoscopy every 5 years or a colonoscopy every 10 years starting at age 73. Prostate cancer screening. Recommendations will vary depending on your family history and other risks. Hepatitis C blood test. Hepatitis B blood test. Sexually transmitted disease (STD) testing. Diabetes screening. This is done by checking your blood sugar (glucose) after you have not eaten for a while (fasting). You may have this done every 1-3 years. Abdominal aortic aneurysm (AAA) screening. You may need this if you are a current or former smoker. Osteoporosis. You may be screened starting at age 73 if you are at high risk. Talk with your health care provider about your test results, treatment options, and if necessary, the need for more tests. Vaccines  Your health care provider may recommend certain vaccines, such as: Influenza vaccine. This is recommended every year. Tetanus, diphtheria,  and acellular pertussis (Tdap, Td) vaccine. You may need a Td booster every 10 years. Zoster vaccine. You may need this after age  73. Pneumococcal 13-valent conjugate (PCV13) vaccine. One dose is recommended after age 23. Pneumococcal polysaccharide (PPSV23) vaccine. One dose is recommended after age 29. Talk to your health care provider about which screenings and vaccines you need and how often you need them. This information is not intended to replace advice given to you by your health care provider. Make sure you discuss any questions you have with your health care provider. Document Released: 11/19/2015 Document Revised: 07/12/2016 Document Reviewed: 08/24/2015 Elsevier Interactive Patient Education  2017 [employer] Prevention in the Home Falls can cause injuries. They can happen to people of all ages. There are many things you can do to make your home safe and to help prevent falls. What can I do on the outside of my home? Regularly fix the edges of walkways and driveways and fix any cracks. Remove anything that might make you trip as you walk through a door, such as a raised step or threshold. Trim any bushes or trees on the path to your home. Use bright outdoor lighting. Clear any walking paths of anything that might make someone trip, such as rocks or tools. Regularly check to see if handrails are loose or broken. Make sure that both sides of any steps have handrails. Any raised decks and porches should have guardrails on the edges. Have any leaves, snow, or ice cleared regularly. Use sand or salt on walking paths during winter. Clean up any spills in your garage right away. This includes oil or grease spills. What can I do in the bathroom? Use night lights. Install grab bars by the toilet and in the tub and shower. Do not use towel bars as grab bars. Use non-skid mats or decals in the tub or shower. If you need to sit down in the shower, use a plastic, non-slip stool. Keep the floor dry. Clean up any water that spills on the floor as soon as it happens. Remove soap buildup in the tub or shower  regularly. Attach bath mats securely with double-sided non-slip rug tape. Do not have throw rugs and other things on the floor that can make you trip. What can I do in the bedroom? Use night lights. Make sure that you have a light by your bed that is easy to reach. Do not use any sheets or blankets that are too big for your bed. They should not hang down onto the floor. Have a firm chair that has side arms. You can use this for support while you get dressed. Do not have throw rugs and other things on the floor that can make you trip. What can I do in the kitchen? Clean up any spills right away. Avoid walking on wet floors. Keep items that you use a lot in easy-to-reach places. If you need to reach something above you, use a strong step stool that has a grab bar. Keep electrical cords out of the way. Do not use floor polish or wax that makes floors slippery. If you must use wax, use non-skid floor wax. Do not have throw rugs and other things on the floor that can make you trip. What can I do with my stairs? Do not leave any items on the stairs. Make sure that there are handrails on both sides of the stairs and use them. Fix handrails that are broken or loose. Make sure  that handrails are as long as the stairways. Check any carpeting to make sure that it is firmly attached to the stairs. Fix any carpet that is loose or worn. Avoid having throw rugs at the top or bottom of the stairs. If you do have throw rugs, attach them to the floor with carpet tape. Make sure that you have a light switch at the top of the stairs and the bottom of the stairs. If you do not have them, ask someone to add them for you. What else can I do to help prevent falls? Wear shoes that: Do not have high heels. Have rubber bottoms. Are comfortable and fit you well. Are closed at the toe. Do not wear sandals. If you use a stepladder: Make sure that it is fully opened. Do not climb a closed stepladder. Make sure that  both sides of the stepladder are locked into place. Ask someone to hold it for you, if possible. Clearly mark and make sure that you can see: Any grab bars or handrails. First and last steps. Where the edge of each step is. Use tools that help you move around (mobility aids) if they are needed. These include: Canes. Walkers. Scooters. Crutches. Turn on the lights when you go into a dark area. Replace any light bulbs as soon as they burn out. Set up your furniture so you have a clear path. Avoid moving your furniture around. If any of your floors are uneven, fix them. If there are any pets around you, be aware of where they are. Review your medicines with your doctor. Some medicines can make you feel dizzy. This can increase your chance of falling. Ask your doctor what other things that you can do to help prevent falls. This information is not intended to replace advice given to you by your health care provider. Make sure you discuss any questions you have with your health care provider. Document Released: 08/19/2009 Document Revised: 03/30/2016 Document Reviewed: 11/27/2014 Elsevier Interactive Patient Education  2017 [employer].

## 2022-08-08 NOTE — Progress Notes (Unsigned)
    Subjective:    Patient ID: Richard Novak, male    DOB: [birth year], 71 y.o.   MRN: [id number]      HPI Richard Novak is here for No chief complaint on file.    Bump on back    Medications and allergies reviewed with patient and updated if appropriate.  Current Outpatient Medications on File Prior to Visit  Medication Sig Dispense Refill   aspirin EC 81 MG tablet Take 81 mg by mouth daily. Swallow whole.     ezetimibe (ZETIA) 10 MG tablet TAKE 1 TABLET BY MOUTH EVERY DAY 90 tablet 3   Flax Oil-Fish Oil-Borage Oil (FISH OIL-FLAX OIL-BORAGE OIL) CAPS Take 2 capsules by mouth 2 (two) times daily.      fluticasone (FLONASE) 50 MCG/ACT nasal spray Place 2 sprays into both nostrils as needed for allergies or rhinitis.     NON FORMULARY Take 1 capsule by mouth 2 (two) times daily. Herbal blend for prostate     rosuvastatin (CRESTOR) 10 MG tablet Take 1 tablet (10 mg total) by mouth daily. 90 tablet 3   vitamin C (ASCORBIC ACID) 500 MG tablet Take 500 mg by mouth 2 (two) times daily.     Vitamin D, Cholecalciferol, 1000 units CAPS Take 1,000 Units by mouth daily.      vitamin E 100 UNIT capsule Take 100 Units by mouth 2 (two) times daily.      ZINC PICOLINATE PO Take 50 mg by mouth daily.      No current facility-administered medications on file prior to visit.    Review of Systems     Objective:  There were no vitals filed for this visit. BP Readings from Last 3 Encounters:  04/05/22 118/78  02/17/22 128/82  02/17/21 118/70   Wt Readings from Last 3 Encounters:  07/14/22 195 lb (88.5 kg)  04/05/22 194 lb 9.6 oz (88.3 kg)  02/17/22 204 lb (92.5 kg)   There is no height or weight on file to calculate BMI.    Physical Exam         Assessment & Plan:    See Problem List for Assessment and Plan of chronic medical problems.

## 2022-08-09 ENCOUNTER — Encounter: Payer: Self-pay | Admitting: Internal Medicine

## 2022-08-09 ENCOUNTER — Ambulatory Visit (INDEPENDENT_AMBULATORY_CARE_PROVIDER_SITE_OTHER): Payer: PPO | Admitting: Internal Medicine

## 2022-08-09 VITALS — BP 102/62 | HR 84 | Temp 98.4°F | Ht 71.0 in | Wt 192.0 lb

## 2022-08-09 DIAGNOSIS — L089 Local infection of the skin and subcutaneous tissue, unspecified: Secondary | ICD-10-CM | POA: Insufficient documentation

## 2022-08-09 DIAGNOSIS — L729 Follicular cyst of the skin and subcutaneous tissue, unspecified: Secondary | ICD-10-CM

## 2022-08-09 MED ORDER — DOXYCYCLINE HYCLATE 100 MG PO TABS: 100.0000 mg | ORAL_TABLET | Freq: Two times a day (BID) | ORAL | 0 refills | Status: AC

## 2022-08-09 NOTE — Assessment & Plan Note (Signed)
Acute History and exam consistent with infected skin cyst He was able to extract some pus at home, but in order to move along healing and help infection I did do an incision and drainage and was able to get out additional pus See procedure note.  He tolerated procedure well Start doxycycline 100 mg twice daily x7 days Advised to do warm compresses Referral to dermatology for possible removal of cyst to prevent recurrent infection He will call with concerns

## 2022-08-09 NOTE — Patient Instructions (Addendum)
   Do warm compresses.     Medications changes include :   doxycycline twice daily for one week   Your prescription(s) have been sent to your pharmacy.    A referral was ordered for Dermatology.     Someone from that office will call you to schedule an appointment.    Return if symptoms worsen or fail to improve.

## 2022-11-24 ENCOUNTER — Ambulatory Visit: Payer: PPO | Admitting: Internal Medicine

## 2022-11-24 ENCOUNTER — Other Ambulatory Visit (INDEPENDENT_AMBULATORY_CARE_PROVIDER_SITE_OTHER): Payer: PPO

## 2022-11-24 ENCOUNTER — Encounter: Payer: Self-pay | Admitting: Internal Medicine

## 2022-11-24 VITALS — BP 128/79 | HR 88 | Ht 71.0 in | Wt 197.0 lb

## 2022-11-24 DIAGNOSIS — K529 Noninfective gastroenteritis and colitis, unspecified: Secondary | ICD-10-CM

## 2022-11-24 DIAGNOSIS — R748 Abnormal levels of other serum enzymes: Secondary | ICD-10-CM

## 2022-11-24 DIAGNOSIS — Z86010 Personal history of colonic polyps: Secondary | ICD-10-CM

## 2022-11-24 LAB — COMPREHENSIVE METABOLIC PANEL
ALT: 30 U/L (ref 0–53)
AST: 23 U/L (ref 0–37)
Albumin: 4.3 g/dL (ref 3.5–5.2)
Alkaline Phosphatase: 48 U/L (ref 39–117)
BUN: 19 mg/dL (ref 6–23)
CO2: 28 mEq/L (ref 19–32)
Calcium: 9.6 mg/dL (ref 8.4–10.5)
Chloride: 103 mEq/L (ref 96–112)
Creatinine, Ser: 1.14 mg/dL (ref 0.40–1.50)
GFR: 63.94 mL/min (ref >60.00)
Glucose, Bld: 82 mg/dL (ref 70–99)
Potassium: 4 mEq/L (ref 3.5–5.1)
Sodium: 139 mEq/L (ref 135–145)
Total Bilirubin: 0.5 mg/dL (ref 0.2–1.2)
Total Protein: 6.9 g/dL (ref 6.0–8.3)

## 2022-11-24 LAB — CBC WITH DIFFERENTIAL/PLATELET
Basophils Absolute: 0.1 10*3/uL (ref 0.0–0.1)
Basophils Relative: 1.1 % (ref 0.0–3.0)
Eosinophils Absolute: 0.1 10*3/uL (ref 0.0–0.7)
Eosinophils Relative: 1.6 % (ref 0.0–5.0)
HCT: 43.2 % (ref 39.0–52.0)
Hemoglobin: 14.6 g/dL (ref 13.0–17.0)
Lymphocytes Relative: 24.1 % (ref 12.0–46.0)
Lymphs Abs: 2.1 10*3/uL (ref 0.7–4.0)
MCHC: 33.9 g/dL (ref 30.0–36.0)
MCV: 94.3 fl (ref 78.0–100.0)
Monocytes Absolute: 0.5 10*3/uL (ref 0.1–1.0)
Monocytes Relative: 5.5 % (ref 3.0–12.0)
Neutro Abs: 6 10*3/uL (ref 1.4–7.7)
Neutrophils Relative %: 67.7 % (ref 43.0–77.0)
Platelets: 222 10*3/uL (ref 150.0–400.0)
RBC: 4.58 Mil/uL (ref 4.22–5.81)
RDW: 13.5 % (ref 11.5–15.5)
WBC: 8.8 10*3/uL (ref 4.0–10.5)

## 2022-11-24 NOTE — Patient Instructions (Signed)
Stop your Fish Oil/Flax Seed/Borage supplement.   Your provider has requested that you go to the basement level for lab work before leaving today. Press "B" on the elevator. The lab is located at the first door on the left as you exit the elevator.  Due to recent changes in healthcare laws, you may see the results of your imaging and laboratory studies on MyChart before your provider has had a chance to review them.  We understand that in some cases there may be results that are confusing or concerning to you. Not all laboratory results come back in the same time frame and the provider may be waiting for multiple results in order to interpret others.  Please give [place] 48 hours in order for your provider to thoroughly review all the results before contacting the office for clarification of your results.   You have been scheduled for a colonoscopy. Please follow written instructions given to you at your visit today.  Please pick up your prep supplies at the pharmacy within the next 1-3 days. If you use inhalers (even only as needed), please bring them with you on the day of your procedure.   I appreciate the opportunity to care for you. Silvano Rusk, MD, [facility]

## 2022-11-24 NOTE — Progress Notes (Signed)
Richard Novak 72 y.o. Apr 17, 1951 [id number]  Assessment & Plan:   Encounter Diagnoses  Name Primary?   Chronic diarrhea Yes   Abnormal transaminases    Personal history of colonic adenomas     Evaluate with colonoscopy.The risks and benefits as well as alternatives of endoscopic procedure(s) have been discussed and reviewed. All questions answered. The patient agrees to proceed.  Stop fish/flax/borage oil supplement it may be aggravating things.  Question what role other supplements may be having.  Though he has stopped those off and on in the past without noticing any difference.  CBC and CMET today.   Subjective:   Chief Complaint: Diarrhea  HPI 72 year old white man with history of diarrhea problems seen here in October 2021 with some intermittent diarrhea, a fecal calprotectin was negative.  He did well for a while but over the past several months he has been having intermittent loose stools several times a day.  Not really a nocturnal problem.  No significant abdominal cramps.  His wife has been ill over the past year and she has been in and out of the hospital with CHF so that has been a stress.  He cannot tell any particular dietary triggers, he uses a lot of supplements but has stopped all those at times and cannot notice any difference.  No rectal bleeding.  He has a history of adenomatous polyps and is due for a surveillance colonoscopy in August of this year. Wt Readings from Last 3 Encounters:  11/24/22 197 lb (89.4 kg)  08/09/22 192 lb (87.1 kg)  07/14/22 195 lb (88.5 kg)   TSH normal in April 23.  CBC and CMET unremarkable then also.  He has had some abnormal transaminases September 2023 when seen in cardiology.  AST 49 ALT 61.  Normal alk phos and bilirubin.   08/24/20      Ref Range & Units 2 yr ago  Calprotectin, Fecal 0 - 120 ug/g <16  Comment: Concentration Interpretation Follow-Up <16 - 50 ug/g Normal None >50 -120 ug/g Borderline Re-evaluate in 4-6  weeks >120 ug/g Abnormal Repeat as clinically indicated  Resulting Agency  [facility]  Narrative Performed at: [facility] [address] [place], [place] [id number] Lab Director: Rush Farmer MD, Phone: [phone]  Notes recorded by Gatha Mayer, MD on 08/27/2020 at 9:58 AM EDT No inflammation or infection. How are you doing? CEG   Allergies  Allergen Reactions   Sulfa Antibiotics Nausea Only    Makes me gag!   Current Meds  Medication Sig   ezetimibe (ZETIA) 10 MG tablet TAKE 1 TABLET BY MOUTH EVERY DAY   Flax Oil-Fish Oil-Borage Oil (FISH OIL-FLAX OIL-BORAGE OIL) CAPS Take 2 capsules by mouth 2 (two) times daily.    fluticasone (FLONASE) 50 MCG/ACT nasal spray Place 2 sprays into both nostrils as needed for allergies or rhinitis.   NON FORMULARY Take 1 capsule by mouth 2 (two) times daily. Herbal blend for prostate   rosuvastatin (CRESTOR) 10 MG tablet Take 1 tablet (10 mg total) by mouth daily.   vitamin C (ASCORBIC ACID) 500 MG tablet Take 500 mg by mouth 2 (two) times daily.   Vitamin D, Cholecalciferol, 1000 units CAPS Take 1,000 Units by mouth daily.    vitamin E 100 UNIT capsule Take 100 Units by mouth 2 (two) times daily.    ZINC PICOLINATE PO Take 50 mg by mouth daily.    Past Medical History:  Diagnosis Date   Bursitis of elbow  left   Nongonococcal urethritis (NGU) due to other specified organism    Personal history of colonic adenomas 03/18/2013   Rotator cuff injury    Left   Tendinitis of left elbow    Urticaria    Past Surgical History:  Procedure Laterality Date   APPENDECTOMY  1968   COLONOSCOPY     OLECRANON BURSECTOMY Left 10/04/2016   Procedure: LEFT OLECRANON BURSECTOMY;  Surgeon: Leandrew Koyanagi, MD;  Location: [facility];  Service: Orthopedics;  Laterality: Left;   ROTATOR CUFF REPAIR  2008   Arthroscopic Repair [facility])   Social History   Social History Narrative   HSG, 2 yrs [employer]. Married '71. 2 daughters - '74,  '77; 2 grandchildren. Marriage in [place]. Retired -but working part-time for [employer]. POA wife. Full resuscitation, short-term mechanical ventilation.      Fun: Work in the yard, fishing at [employer]   Denies religious beliefs effecting health care.          family history includes Cancer in his father; Coronary artery disease in his father; Dementia in his mother; Healthy in his maternal grandmother and paternal grandmother; Heart disease in his father; Hiatal hernia in his paternal grandfather; Hypertension in his mother; Prostate cancer in his father; Testicular cancer in his maternal grandfather.   Review of Systems As per HPI  Objective:   Physical Exam '@BP'$  128/79   Pulse 88   Ht '5\' 11"'$  (1.803 m)   Wt 197 lb (89.4 kg)   BMI 27.48 kg/m @  General:  NAD Eyes:   anicteric Lungs:  clear Heart::  S1S2 no rubs, murmurs or gallops Abdomen:  soft and nontender, BS+ Ext:   no edema, cyanosis or clubbing    Data Reviewed:  See HPI

## 2022-12-19 ENCOUNTER — Encounter: Payer: Self-pay | Admitting: Internal Medicine

## 2022-12-27 ENCOUNTER — Ambulatory Visit (AMBULATORY_SURGERY_CENTER): Payer: PPO | Admitting: Internal Medicine

## 2022-12-27 ENCOUNTER — Encounter: Payer: Self-pay | Admitting: Internal Medicine

## 2022-12-27 VITALS — BP 119/72 | HR 75 | Temp 98.9°F | Resp 13 | Ht 71.0 in | Wt 197.0 lb

## 2022-12-27 DIAGNOSIS — K529 Noninfective gastroenteritis and colitis, unspecified: Secondary | ICD-10-CM

## 2022-12-27 DIAGNOSIS — Z0389 Encounter for observation for other suspected diseases and conditions ruled out: Secondary | ICD-10-CM | POA: Diagnosis not present

## 2022-12-27 DIAGNOSIS — Z86010 Personal history of colonic polyps: Secondary | ICD-10-CM

## 2022-12-27 DIAGNOSIS — Z09 Encounter for follow-up examination after completed treatment for conditions other than malignant neoplasm: Secondary | ICD-10-CM | POA: Diagnosis not present

## 2022-12-27 DIAGNOSIS — R197 Diarrhea, unspecified: Secondary | ICD-10-CM | POA: Diagnosis not present

## 2022-12-27 NOTE — Progress Notes (Unsigned)
Called to room to assist during endoscopic procedure.  Patient ID and intended procedure confirmed with present staff. Received instructions for my participation in the procedure from the performing physician.  

## 2022-12-27 NOTE — Progress Notes (Unsigned)
VS completed by CW.   Pt's states no medical or surgical changes since previsit or office visit.  

## 2022-12-27 NOTE — Patient Instructions (Addendum)
No polyps seen - I took biopsies to see if there is microscopic inflammation that could explain the diarrhea. I will let you know.  I do not recommend a routine repeat colonoscopy.  I appreciate the opportunity to care for you.  Gatha Mayer, MD, [facility]  Resume previous diet.  Continue present medications.   YOU HAD AN ENDOSCOPIC PROCEDURE TODAY AT [facility]:   Refer to the procedure report that was given to you for any specific questions about what was found during the examination.  If the procedure report does not answer your questions, please call your gastroenterologist to clarify.  If you requested that your care partner not be given the details of your procedure findings, then the procedure report has been included in a sealed envelope for you to review at your convenience later.  YOU SHOULD EXPECT: Some feelings of bloating in the abdomen. Passage of more gas than usual.  Walking can help get rid of the air that was put into your GI tract during the procedure and reduce the bloating. If you had a lower endoscopy (such as a colonoscopy or flexible sigmoidoscopy) you may notice spotting of blood in your stool or on the toilet paper. If you underwent a bowel prep for your procedure, you may not have a normal bowel movement for a few days.  Please Note:  You might notice some irritation and congestion in your nose or some drainage.  This is from the oxygen used during your procedure.  There is no need for concern and it should clear up in a day or so.  SYMPTOMS TO REPORT IMMEDIATELY:  Following lower endoscopy (colonoscopy or flexible sigmoidoscopy):  Excessive amounts of blood in the stool  Significant tenderness or worsening of abdominal pains  Swelling of the abdomen that is new, acute  Fever of 100F or higher  For urgent or emergent issues, a gastroenterologist can be reached at any hour by calling [phone]. Do not use MyChart messaging for urgent concerns.     DIET:  We do recommend a small meal at first, but then you may proceed to your regular diet.  Drink plenty of fluids but you should avoid alcoholic beverages for 24 hours.  ACTIVITY:  You should plan to take it easy for the rest of today and you should NOT DRIVE or use heavy machinery until tomorrow (because of the sedation medicines used during the test).    FOLLOW UP: Our staff will call the number listed on your records the next business day following your procedure.  We will call around 7:15- 8:00 am to check on you and address any questions or concerns that you may have regarding the information given to you following your procedure. If we do not reach you, we will leave a message.     If any biopsies were taken you will be contacted by phone or by letter within the next 1-3 weeks.  Please call us at [phone] if you have not heard about the biopsies in 3 weeks.    SIGNATURES/CONFIDENTIALITY: You and/or your care partner have signed paperwork which will be entered into your electronic medical record.  These signatures attest to the fact that that the information above on your After Visit Summary has been reviewed and is understood.  Full responsibility of the confidentiality of this discharge information lies with you and/or your care-partner.

## 2022-12-27 NOTE — Progress Notes (Unsigned)
Uneventful anesthetic. Report to pacu rn. Vss. Care resumed by rn.

## 2022-12-27 NOTE — Progress Notes (Unsigned)
[facility] History and Physical   Primary Care Physician:  Binnie Rail, MD   Reason for Procedure:   Diarrhea + hx adenomatous colon polyps  Plan:    colonoscopy     HPI: Richard Novak is a 72 y.o. male with history of diarrhea problems seen here in October 2021 with some intermittent diarrhea, a fecal calprotectin was negative.  He did well for a while but over the past several months he has been having intermittent loose stools several times a day.  Not really a nocturnal problem.  No significant abdominal cramps.  His wife has been ill over the past year and she has been in and out of the hospital with CHF so that has been a stress.  He cannot tell any particular dietary triggers, he uses a lot of supplements but has stopped all those at times and cannot notice any difference.  No rectal bleeding.  He has a history of adenomatous polyps and is due for a surveillance colonoscopy in August of this year.    Wt Readings from Last 3 Encounters:  11/24/22 197 lb (89.4 kg)  08/09/22 192 lb (87.1 kg)  07/14/22 195 lb (88.5 kg)    TSH normal in April 23.  CBC and CMET unremarkable then also.  He has had some abnormal transaminases September 2023 when seen in cardiology.  AST 49 ALT 61.  Normal alk phos and bilirubin.    08/24/20          Ref Range & Units 2 yr ago  Calprotectin, Fecal 0 - 120 ug/g <16  Comment: Concentration Interpretation Follow-Up <16 - 50 ug/g Normal None >50 -120 ug/g Borderline Re-evaluate in 4-6 weeks >120 ug/g Abnormal Repeat as clinically indicated         Past Medical History:  Diagnosis Date   Bursitis of elbow    left   Nongonococcal urethritis (NGU) due to other specified organism    Personal history of colonic adenomas 03/18/2013   Rotator cuff injury    Left   Tendinitis of left elbow    Urticaria     Past Surgical History:  Procedure Laterality Date   APPENDECTOMY  1968   COLONOSCOPY     OLECRANON BURSECTOMY Left 10/04/2016    Procedure: LEFT OLECRANON BURSECTOMY;  Surgeon: Leandrew Koyanagi, MD;  Location: [facility];  Service: Orthopedics;  Laterality: Left;   ROTATOR CUFF REPAIR  2008   Arthroscopic Repair ([facility])    Prior to Admission medications   Medication Sig Start Date End Date Taking? Authorizing Provider  ezetimibe (ZETIA) 10 MG tablet TAKE 1 TABLET BY MOUTH EVERY DAY 05/29/22   Burnell Blanks, MD  fluticasone (FLONASE) 50 MCG/ACT nasal spray Place 2 sprays into both nostrils as needed for allergies or rhinitis.    [provider]  NON FORMULARY Take 1 capsule by mouth 2 (two) times daily. Herbal blend for prostate    [provider]  rosuvastatin (CRESTOR) 10 MG tablet Take 1 tablet (10 mg total) by mouth daily. 04/07/22   Burnell Blanks, MD  vitamin C (ASCORBIC ACID) 500 MG tablet Take 500 mg by mouth 2 (two) times daily.    [provider]  Vitamin D, Cholecalciferol, 1000 units CAPS Take 1,000 Units by mouth daily.     [provider]  vitamin E 100 UNIT capsule Take 100 Units by mouth 2 (two) times daily.     [provider]  ZINC PICOLINATE PO  Take 50 mg by mouth daily.     [provider]    Current Outpatient Medications  Medication Sig Dispense Refill   ezetimibe (ZETIA) 10 MG tablet TAKE 1 TABLET BY MOUTH EVERY DAY 90 tablet 3   fluticasone (FLONASE) 50 MCG/ACT nasal spray Place 2 sprays into both nostrils as needed for allergies or rhinitis.     NON FORMULARY Take 1 capsule by mouth 2 (two) times daily. Herbal blend for prostate     rosuvastatin (CRESTOR) 10 MG tablet Take 1 tablet (10 mg total) by mouth daily. 90 tablet 3   vitamin C (ASCORBIC ACID) 500 MG tablet Take 500 mg by mouth 2 (two) times daily.     Vitamin D, Cholecalciferol, 1000 units CAPS Take 1,000 Units by mouth daily.      vitamin E 100 UNIT capsule Take 100 Units by mouth 2 (two) times daily.      ZINC PICOLINATE PO Take 50 mg by mouth daily.       No current facility-administered medications for this visit.    Allergies as of 12/27/2022 - Review Complete 11/24/2022  Allergen Reaction Noted   Sulfa antibiotics Nausea Only 03/14/2013    Family History  Problem Relation Age of Onset   Hypertension Mother    Dementia Mother    Prostate cancer Father    Coronary artery disease Father    Cancer Father        lung cancer   Heart disease Father        CAD/CABG   Healthy Maternal Grandmother    Testicular cancer Maternal Grandfather    Healthy Paternal Grandmother    Hiatal hernia Paternal Grandfather    Diabetes Neg Hx    Heart attack Neg Hx    Colon cancer Neg Hx    Hyperlipidemia Neg Hx    Rectal cancer Neg Hx    Stomach cancer Neg Hx     Social History   Socioeconomic History   Marital status: Married    Spouse name: Not on file   Number of children: 2   Years of education: 14   Highest education level: Not on file  Occupational History   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 Married '71. 2 daughters - '74, '77; 2 grandchildren. Marriage in [place]. Retired -but working part-time for [employer]. POA wife. Full resuscitation, short-term mechanical ventilation.      Fun: Work in the yard, fishing at [employer]   Denies religious beliefs effecting health care.          Social Determinants of Health   Financial  Resource Strain: Low Risk  (07/14/2022)   Overall Financial Resource Strain (CARDIA)    Difficulty of Paying Living Expenses: Not  hard at all  Food Insecurity: No Food Insecurity (07/14/2022)   Hunger Vital Sign    Worried About Running Out of Food in the Last Year: Never true    Ran Out of Food in the Last Year: Never true  Transportation Needs: No Transportation Needs (07/14/2022)   PRAPARE - Hydrologist (Medical): No    Lack of Transportation (Non-Medical): No  Physical Activity: Insufficiently Active (07/14/2022)   Exercise Vital Sign    Days of Exercise per Week: 3 days    Minutes of Exercise per Session: 30 min  Stress: No Stress Concern Present (07/14/2022)   [place]    Feeling of Stress : Not at all  Social Connections: Moderately Integrated (07/14/2022)   Social Connection and Isolation Panel [NHANES]    Frequency of Communication with Friends and Family: More than three times a week    Frequency of Social Gatherings with Friends and Family: More than three times a week    Attends Religious Services: More than 4 times per year    Active Member of [employer] or Organizations: No    Attends Archivist Meetings: Never    Marital Status: Married  Human resources officer Violence: Not At Risk (07/14/2022)   Humiliation, Afraid, Rape, and Kick questionnaire    Fear of Current or Ex-Partner: No    Emotionally Abused: No    Physically Abused: No    Sexually Abused: No    Review of Systems:  All other review of systems negative except as mentioned in the HPI.  Physical Exam: Vital signs BP (!) 140/78   Pulse 85   Temp 98.9 F (37.2 C) (Temporal)   Ht 5' 11"$  (1.803 m)   Wt 197 lb (89.4 kg)   SpO2 97%   BMI 27.48 kg/m   General:   Alert,  Well-developed, well-nourished, pleasant and cooperative in NAD Lungs:  Clear throughout to auscultation.   Heart:  Regular rate and rhythm; no  murmurs, clicks, rubs,  or gallops. Abdomen:  Soft, nontender and nondistended. Normal bowel sounds.   Neuro/Psych:  Alert and cooperative. Normal mood and affect. A and O x 3   @Rashee Marschall$  Simonne Maffucci, MD, [facility] [phone] (pager) 12/27/2022 1:25 PM@

## 2022-12-27 NOTE — Op Note (Signed)
[name] Procedure Date: 12/27/2022 2:03 PM MRN: [id number] Endoscopist: Gatha Mayer , MD, [id number] Age: 72 Referring MD:  Date of Birth: September 25, 1951 Gender: Male Account #: [id number] Procedure:                Colonoscopy Indications:              Chronic diarrhea Medicines:                Monitored Anesthesia Care Procedure:                Pre-Anesthesia Assessment:                           - Prior to the procedure, a History and Physical                            was performed, and patient medications and                            allergies were reviewed. The patient's tolerance of                            previous anesthesia was also reviewed. The risks                            and benefits of the procedure and the sedation                            options and risks were discussed with the patient.                            All questions were answered, and informed consent                            was obtained. Prior Anticoagulants: The patient has                            taken no anticoagulant or antiplatelet agents. ASA                            Grade Assessment: II - A patient with mild systemic                            disease. After reviewing the risks and benefits,                            the patient was deemed in satisfactory condition to                            undergo the procedure.                           After obtaining informed consent, the colonoscope  was passed under direct vision. Throughout the                            procedure, the patient's blood pressure, pulse, and                            oxygen saturations were monitored continuously. The                            Olympus CF-HQ190L [id number]) Colonoscope was                            introduced through the anus and advanced to the the                            terminal ileum, with identification of the                             appendiceal orifice and IC valve. The colonoscopy                            was performed without difficulty. The patient                            tolerated the procedure well. The quality of the                            bowel preparation was good. The terminal ileum,                            ileocecal valve, appendiceal orifice, and rectum                            were photographed. Scope In: 2:26:40 PM Scope Out: 2:44:20 PM Scope Withdrawal Time: 0 hours 13 minutes 0 seconds  Total Procedure Duration: 0 hours 17 minutes 40 seconds  Findings:                 The perianal and digital rectal examinations were                            normal. Pertinent negatives include normal prostate                            (size, shape, and consistency).                           The terminal ileum appeared normal.                           The entire examined colon appeared normal on direct                            and retroflexion views.  Biopsies for histology were taken with a cold                            forceps from the entire colon for evaluation of                            microscopic colitis.                           The exam was otherwise without abnormality on                            direct and retroflexion views. Complications:            No immediate complications. Estimated Blood Loss:     Estimated blood loss was minimal. Impression:               - The examined portion of the ileum was normal.                           - The entire examined colon is normal on direct and                            retroflexion views.                           - The examination was otherwise normal on direct                            and retroflexion views.                           - Biopsies were taken with a cold forceps from the                            entire colon for evaluation of microscopic colitis.                            - Personal history of colonic polyps. 2004 - 3                            diminutive adenomas                           2007 no polyps                           03/18/2013 2 sigmoid polyps, one diminutive and one                            8-10 mm - TV adenoma and a hyperplastic polyp                           06/28/2018 no polyps Recommendation:           - Patient has a contact number available for  emergencies. The signs and symptoms of potential                            delayed complications were discussed with the                            patient. Return to normal activities tomorrow.                            Written discharge instructions were provided to the                            patient.                           - Resume previous diet.                           - Continue present medications.                           - Await pathology results.                           - No repeat colonoscopy due to age and the absence                            of colonic polyps. Gatha Mayer, MD 12/27/2022 2:51:50 PM This report has been signed electronically.

## 2022-12-28 ENCOUNTER — Telehealth: Payer: Self-pay

## 2022-12-28 NOTE — Telephone Encounter (Signed)
  Follow up Call-     12/27/2022    1:21 PM  Call back number  Post procedure Call Back phone  # [phone]  Permission to leave phone message Yes     Patient questions:  Do you have a fever, pain , or abdominal swelling? No. Pain Score  0 *  Have you tolerated food without any problems? Yes.    Have you been able to return to your normal activities? Yes.    Do you have any questions about your discharge instructions: Diet   No. Medications  No. Follow up visit  No.  Do you have questions or concerns about your Care? No.  Actions: * If pain score is 4 or above: No action needed, pain <4.

## 2022-12-29 DIAGNOSIS — H35371 Puckering of macula, right eye: Secondary | ICD-10-CM | POA: Diagnosis not present

## 2022-12-29 DIAGNOSIS — H5203 Hypermetropia, bilateral: Secondary | ICD-10-CM | POA: Diagnosis not present

## 2022-12-29 DIAGNOSIS — H2513 Age-related nuclear cataract, bilateral: Secondary | ICD-10-CM | POA: Diagnosis not present

## 2023-01-11 DIAGNOSIS — H269 Unspecified cataract: Secondary | ICD-10-CM | POA: Diagnosis not present

## 2023-01-11 DIAGNOSIS — H2511 Age-related nuclear cataract, right eye: Secondary | ICD-10-CM | POA: Diagnosis not present

## 2023-01-25 DIAGNOSIS — H2512 Age-related nuclear cataract, left eye: Secondary | ICD-10-CM | POA: Diagnosis not present

## 2023-01-25 DIAGNOSIS — H21562 Pupillary abnormality, left eye: Secondary | ICD-10-CM | POA: Diagnosis not present

## 2023-01-25 DIAGNOSIS — H269 Unspecified cataract: Secondary | ICD-10-CM | POA: Diagnosis not present

## 2023-02-21 ENCOUNTER — Encounter: Payer: Self-pay | Admitting: Internal Medicine

## 2023-02-21 NOTE — Progress Notes (Unsigned)
Subjective:    Patient ID: Richard Novak, male    DOB: [birth year], 72 y.o.   MRN: [id number]     HPI Andreas is here for a physical exam and his chronic medical problems.   Recent sinus infection - still with some residual symptoms.   Can go 6-9 weeks with diarrhea - changes diet and it takes weeks to get back on track.  Recent colonoscopy normal.   Medications and allergies reviewed with patient and updated if appropriate.  Current Outpatient Medications on File Prior to Visit  Medication Sig Dispense Refill   ezetimibe (ZETIA) 10 MG tablet TAKE 1 TABLET BY MOUTH EVERY DAY 90 tablet 3   fluticasone (FLONASE) 50 MCG/ACT nasal spray Place 2 sprays into both nostrils as needed for allergies or rhinitis.     NON FORMULARY Take 1 capsule by mouth 2 (two) times daily. Herbal blend for prostate     rosuvastatin (CRESTOR) 10 MG tablet Take 1 tablet (10 mg total) by mouth daily. 90 tablet 3   vitamin C (ASCORBIC ACID) 500 MG tablet Take 500 mg by mouth 2 (two) times daily.     Vitamin D, Cholecalciferol, 1000 units CAPS Take 1,000 Units by mouth daily.      vitamin E 100 UNIT capsule Take 100 Units by mouth 2 (two) times daily.      ZINC PICOLINATE PO Take 50 mg by mouth daily.      No current facility-administered medications on file prior to visit.    Review of Systems  Constitutional:  Negative for fever.  Eyes:  Negative for visual disturbance.  Respiratory:  Negative for cough, shortness of breath and wheezing.   Cardiovascular:  Negative for chest pain, palpitations and leg swelling.  Gastrointestinal:  Negative for abdominal pain, blood in stool, constipation and diarrhea.       Occ gerd  Genitourinary:  Negative for difficulty urinating and dysuria.  Musculoskeletal:  Positive for arthralgias (mild). Negative for back pain.  Skin:  Negative for rash.  Neurological:  Negative for light-headedness and headaches.  Psychiatric/Behavioral:  Negative for dysphoric mood. The  patient is not nervous/anxious.        Objective:   Vitals:   02/22/23 0946  BP: 110/70  Pulse: 85  Temp: 98.2 F (36.8 C)  SpO2: 98%   Filed Weights   02/22/23 0946  Weight: 202 lb (91.6 kg)   Body mass index is 28.17 kg/m.  BP Readings from Last 3 Encounters:  02/22/23 110/70  12/27/22 119/72  11/24/22 128/79    Wt Readings from Last 3 Encounters:  02/22/23 202 lb (91.6 kg)  12/27/22 197 lb (89.4 kg)  11/24/22 197 lb (89.4 kg)      Physical Exam Constitutional: He appears well-developed and well-nourished. No distress.  HENT:  Head: Normocephalic and atraumatic.  Right Ear: External ear normal.  Left Ear: External ear normal.  Normal ear canals and TM b/l  Mouth/Throat: Oropharynx is clear and moist. Eyes: Conjunctivae and EOM are normal.  Neck: Neck supple. No tracheal deviation present. No thyromegaly present.  No carotid bruit  Cardiovascular: Normal rate, regular rhythm, normal heart sounds and intact distal pulses.   No murmur heard.  No lower extremity edema. Pulmonary/Chest: Effort normal and breath sounds normal. No respiratory distress. He has no wheezes. He has no rales.  Abdominal: Soft. He exhibits no distension. There is no tenderness.  Genitourinary: deferred  Lymphadenopathy:   He has no cervical adenopathy.  Skin: Skin  is warm and dry. He is not diaphoretic.  Psychiatric: He has a normal mood and affect. His behavior is normal.         Assessment & Plan:   Physical exam: Screening blood work  ordered Exercise   not regular - caring for his wife Weight  good for age Substance abuse   none   Reviewed recommended immunizations.   Health Maintenance  Topic Date Due   Zoster Vaccines- Shingrix (1 of 2) Never done   INFLUENZA VACCINE  06/07/2023   Medicare Annual Wellness (AWV)  07/15/2023   DTaP/Tdap/Td (2 - Td or Tdap) 10/19/2026   COLONOSCOPY (Pts 45-53yrs Insurance coverage will need to be confirmed)  12/28/2027   Pneumonia  Vaccine 73+ Years old  Completed   Hepatitis C Screening  Completed   HPV VACCINES  Aged Out   COVID-19 Vaccine  Discontinued     See Problem List for Assessment and Plan of chronic medical problems.

## 2023-02-21 NOTE — Patient Instructions (Addendum)
Blood work was ordered.   The lab is on the first floor.    Medications changes include :   none    A referral was ordered for XXX.     Someone will call you to schedule an appointment.    Return in about 1 year (around 02/22/2024) for Physical Exam.   Health Maintenance, Male Adopting a healthy lifestyle and getting preventive care are important in promoting health and wellness. Ask your health care provider about: The right schedule for you to have regular tests and exams. Things you can do on your own to prevent diseases and keep yourself healthy. What should I know about diet, weight, and exercise? Eat a healthy diet  Eat a diet that includes plenty of vegetables, fruits, low-fat dairy products, and lean protein. Do not eat a lot of foods that are high in solid fats, added sugars, or sodium. Maintain a healthy weight Body mass index (BMI) is a measurement that can be used to identify possible weight problems. It estimates body fat based on height and weight. Your health care provider can help determine your BMI and help you achieve or maintain a healthy weight. Get regular exercise Get regular exercise. This is one of the most important things you can do for your health. Most adults should: Exercise for at least 150 minutes each week. The exercise should increase your heart rate and make you sweat (moderate-intensity exercise). Do strengthening exercises at least twice a week. This is in addition to the moderate-intensity exercise. Spend less time sitting. Even light physical activity can be beneficial. Watch cholesterol and blood lipids Have your blood tested for lipids and cholesterol at 74 years of age, then have this test every 5 years. You may need to have your cholesterol levels checked more often if: Your lipid or cholesterol levels are high. You are older than 74 years of age. You are at high risk for heart disease. What should I know about cancer  screening? Many types of cancers can be detected early and may often be prevented. Depending on your health history and family history, you may need to have cancer screening at various ages. This may include screening for: Colorectal cancer. Prostate cancer. Skin cancer. Lung cancer. What should I know about heart disease, diabetes, and high blood pressure? Blood pressure and heart disease High blood pressure causes heart disease and increases the risk of stroke. This is more likely to develop in people who have high blood pressure readings or are overweight. Talk with your health care provider about your target blood pressure readings. Have your blood pressure checked: Every 3-5 years if you are 74-56 years of age. Every year if you are 60 years old or older. If you are between the ages of 65 and 60 and are a current or former smoker, ask your health care provider if you should have a one-time screening for abdominal aortic aneurysm (AAA). Diabetes Have regular diabetes screenings. This checks your fasting blood sugar level. Have the screening done: Once every three years after age 74 if you are at a normal weight and have a low risk for diabetes. More often and at a younger age if you are overweight or have a high risk for diabetes. What should I know about preventing infection? Hepatitis B If you have a higher risk for hepatitis B, you should be screened for this virus. Talk with your health care provider to find out if you are at risk for  hepatitis B infection. Hepatitis C Blood testing is recommended for: Everyone born from [birth year] through 1967. Anyone with known risk factors for hepatitis C. Sexually transmitted infections (STIs) You should be screened each year for STIs, including gonorrhea and chlamydia, if: You are sexually active and are younger than 74 years of age. You are older than 74 years of age and your health care provider tells you that you are at risk for this type of  infection. Your sexual activity has changed since you were last screened, and you are at increased risk for chlamydia or gonorrhea. Ask your health care provider if you are at risk. Ask your health care provider about whether you are at high risk for HIV. Your health care provider may recommend a prescription medicine to help prevent HIV infection. If you choose to take medicine to prevent HIV, you should first get tested for HIV. You should then be tested every 3 months for as long as you are taking the medicine. Follow these instructions at home: Alcohol use Do not drink alcohol if your health care provider tells you not to drink. If you drink alcohol: Limit how much you have to 0-2 drinks a day. Know how much alcohol is in your drink. In the [place] one drink equals one 12 oz bottle of beer (355 mL), one 5 oz glass of wine (148 mL), or one 1 oz glass of hard liquor (44 mL). Lifestyle Do not use any products that contain nicotine or tobacco. These products include cigarettes, chewing tobacco, and vaping devices, such as e-cigarettes. If you need help quitting, ask your health care provider. Do not use street drugs. Do not share needles. Ask your health care provider for help if you need support or information about quitting drugs. General instructions Schedule regular health, dental, and eye exams. Stay current with your vaccines. Tell your health care provider if: You often feel depressed. You have ever been abused or do not feel safe at home. Summary Adopting a healthy lifestyle and getting preventive care are important in promoting health and wellness. Follow your health care provider's instructions about healthy diet, exercising, and getting tested or screened for diseases. Follow your health care provider's instructions on monitoring your cholesterol and blood pressure. This information is not intended to replace advice given to you by your health care provider. Make sure you discuss  any questions you have with your health care provider. Document Revised: 03/14/2021 Document Reviewed: 03/14/2021 Elsevier Patient Education  [employer].

## 2023-02-22 ENCOUNTER — Encounter: Payer: Self-pay | Admitting: Internal Medicine

## 2023-02-22 ENCOUNTER — Ambulatory Visit (INDEPENDENT_AMBULATORY_CARE_PROVIDER_SITE_OTHER): Payer: PPO | Admitting: Internal Medicine

## 2023-02-22 VITALS — BP 110/70 | HR 85 | Temp 98.2°F | Ht 71.0 in | Wt 202.0 lb

## 2023-02-22 DIAGNOSIS — E782 Mixed hyperlipidemia: Secondary | ICD-10-CM | POA: Diagnosis not present

## 2023-02-22 DIAGNOSIS — I2510 Atherosclerotic heart disease of native coronary artery without angina pectoris: Secondary | ICD-10-CM | POA: Diagnosis not present

## 2023-02-22 DIAGNOSIS — I779 Disorder of arteries and arterioles, unspecified: Secondary | ICD-10-CM | POA: Insufficient documentation

## 2023-02-22 DIAGNOSIS — I6521 Occlusion and stenosis of right carotid artery: Secondary | ICD-10-CM | POA: Diagnosis not present

## 2023-02-22 DIAGNOSIS — Z0000 Encounter for general adult medical examination without abnormal findings: Secondary | ICD-10-CM

## 2023-02-22 DIAGNOSIS — R7303 Prediabetes: Secondary | ICD-10-CM

## 2023-02-22 LAB — COMPREHENSIVE METABOLIC PANEL
ALT: 26 U/L (ref 0–53)
AST: 24 U/L (ref 0–37)
Albumin: 4.3 g/dL (ref 3.5–5.2)
Alkaline Phosphatase: 53 U/L (ref 39–117)
BUN: 26 mg/dL — ABNORMAL HIGH (ref 6–23)
CO2: 31 mEq/L (ref 19–32)
Calcium: 9.7 mg/dL (ref 8.4–10.5)
Chloride: 99 mEq/L (ref 96–112)
Creatinine, Ser: 1.27 mg/dL (ref 0.40–1.50)
GFR: 56.07 mL/min — ABNORMAL LOW (ref >60.00)
Glucose, Bld: 101 mg/dL — ABNORMAL HIGH (ref 70–99)
Potassium: 4.6 mEq/L (ref 3.5–5.1)
Sodium: 135 mEq/L (ref 135–145)
Total Bilirubin: 0.8 mg/dL (ref 0.2–1.2)
Total Protein: 6.9 g/dL (ref 6.0–8.3)

## 2023-02-22 LAB — LIPID PANEL
Cholesterol: 92 mg/dL (ref 0–200)
HDL: 41.7 mg/dL (ref >39.00)
LDL Cholesterol: 21 mg/dL (ref 0–99)
NonHDL: 50.4
Total CHOL/HDL Ratio: 2
Triglycerides: 145 mg/dL (ref 0.0–149.0)
VLDL: 29 mg/dL (ref 0.0–40.0)

## 2023-02-22 LAB — HEMOGLOBIN A1C: Hgb A1c MFr Bld: 6.2 % (ref 4.6–6.5)

## 2023-02-22 LAB — TSH: TSH: 0.92 u[IU]/mL (ref 0.35–5.50)

## 2023-02-22 NOTE — Assessment & Plan Note (Addendum)
Chronic Following with cardiology No symptoms consistent with angina Aspirin 81 mg daily, Zetia 10 mg daily, crestor 10 mg daily Cbc, cmp, lipid, tsh

## 2023-02-22 NOTE — Assessment & Plan Note (Signed)
Chronic Regular exercise and healthy diet  Check lipid panel, cmp, tsh Continue Zetia 10 mg daily, crestor 10 mg daily

## 2023-02-22 NOTE — Assessment & Plan Note (Signed)
Chronic Check a1c Low sugar / carb diet Stressed regular exercise  

## 2023-03-11 ENCOUNTER — Encounter: Payer: Self-pay | Admitting: Cardiovascular Disease

## 2023-03-12 NOTE — Telephone Encounter (Signed)
Labs done recently at PCP office. (Lipids/cmet). Last echo was 03/2020:   Richard Hazel, MD 04/02/2020 12:28 PM EDT     His heart is strong. No valve disease. His heart muscle is stiff which is expected with age. Can we let him know? Thanks, chris

## 2023-03-15 ENCOUNTER — Telehealth: Payer: Self-pay | Admitting: Internal Medicine

## 2023-03-15 NOTE — Telephone Encounter (Signed)
Contacted Richard Novak to schedule their annual wellness visit. Appointment made for 03/27/2023.  [facility] Care Guide [facility] AWV TEAM Direct Dial: [phone]

## 2023-03-26 DIAGNOSIS — B078 Other viral warts: Secondary | ICD-10-CM | POA: Diagnosis not present

## 2023-03-26 DIAGNOSIS — L821 Other seborrheic keratosis: Secondary | ICD-10-CM | POA: Diagnosis not present

## 2023-03-27 ENCOUNTER — Ambulatory Visit (INDEPENDENT_AMBULATORY_CARE_PROVIDER_SITE_OTHER): Payer: PPO

## 2023-03-27 VITALS — Wt 202.0 lb

## 2023-03-27 DIAGNOSIS — Z0000 Encounter for general adult medical examination without abnormal findings: Secondary | ICD-10-CM | POA: Diagnosis not present

## 2023-03-27 NOTE — Patient Instructions (Signed)
Richard Novak , Thank you for taking time to come for your Medicare Wellness Visit. I appreciate your ongoing commitment to your health goals. Please review the following plan we discussed and let me know if I can assist you in the future.   These are the goals we discussed:  Goals       Patient Stated     Patient Stated (pt-stated)      Get cholesterol under control and watch intake of fats/eat more vegetables.      Other     Patient Stated      Stay as healthy and as independent as possible. Enjoy life and travel to [place] with family.        This is a list of the screening recommended for you and due dates:  Health Maintenance  Topic Date Due   Zoster (Shingles) Vaccine (1 of 2) 05/24/2023*   Flu Shot  06/07/2023   Medicare Annual Wellness Visit  03/26/2024   DTaP/Tdap/Td vaccine (2 - Td or Tdap) 10/19/2026   Pneumonia Vaccine  Completed   Hepatitis C Screening: USPSTF Recommendation to screen - Ages 40-79 yo.  Completed   HPV Vaccine  Aged Out   Colon Cancer Screening  Discontinued   COVID-19 Vaccine  Discontinued  *Topic was postponed. The date shown is not the original due date.    Advanced directives: Please bring a copy of your health care power of attorney and living will to the office to be added to your chart at your convenience.   Conditions/risks identified: Aim for 30 minutes of exercise or brisk walking, 6-8 glasses of water, and 5 servings of fruits and vegetables each day.   Next appointment: Follow up in one year for your annual wellness visit. 03/27/2024  Preventive Care 65 Years and Older, Male  Preventive care refers to lifestyle choices and visits with your health care provider that can promote health and wellness. What does preventive care include? A yearly physical exam. This is also called an annual well check. Dental exams once or twice a year. Routine eye exams. Ask your health care provider how often you should have your eyes checked. Personal  lifestyle choices, including: Daily care of your teeth and gums. Regular physical activity. Eating a healthy diet. Avoiding tobacco and drug use. Limiting alcohol use. Practicing safe sex. Taking low doses of aspirin every day. Taking vitamin and mineral supplements as recommended by your health care provider. What happens during an annual well check? The services and screenings done by your health care provider during your annual well check will depend on your age, overall health, lifestyle risk factors, and family history of disease. Counseling  Your health care provider may ask you questions about your: Alcohol use. Tobacco use. Drug use. Emotional well-being. Home and relationship well-being. Sexual activity. Eating habits. History of falls. Memory and ability to understand (cognition). Work and work Astronomer. Screening  You may have the following tests or measurements: Height, weight, and BMI. Blood pressure. Lipid and cholesterol levels. These may be checked every 5 years, or more frequently if you are over 6 years old. Skin check. Lung cancer screening. You may have this screening every year starting at age 54 if you have a 30-pack-year history of smoking and currently smoke or have quit within the past 15 years. Fecal occult blood test (FOBT) of the stool. You may have this test every year starting at age 18. Flexible sigmoidoscopy or colonoscopy. You may have a sigmoidoscopy every 5  years or a colonoscopy every 10 years starting at age 53. Prostate cancer screening. Recommendations will vary depending on your family history and other risks. Hepatitis C blood test. Hepatitis B blood test. Sexually transmitted disease (STD) testing. Diabetes screening. This is done by checking your blood sugar (glucose) after you have not eaten for a while (fasting). You may have this done every 1-3 years. Abdominal aortic aneurysm (AAA) screening. You may need this if you are a  current or former smoker. Osteoporosis. You may be screened starting at age 25 if you are at high risk. Talk with your health care provider about your test results, treatment options, and if necessary, the need for more tests. Vaccines  Your health care provider may recommend certain vaccines, such as: Influenza vaccine. This is recommended every year. Tetanus, diphtheria, and acellular pertussis (Tdap, Td) vaccine. You may need a Td booster every 10 years. Zoster vaccine. You may need this after age 74. Pneumococcal 13-valent conjugate (PCV13) vaccine. One dose is recommended after age 47. Pneumococcal polysaccharide (PPSV23) vaccine. One dose is recommended after age 26. Talk to your health care provider about which screenings and vaccines you need and how often you need them. This information is not intended to replace advice given to you by your health care provider. Make sure you discuss any questions you have with your health care provider. Document Released: 11/19/2015 Document Revised: 07/12/2016 Document Reviewed: 08/24/2015 Elsevier Interactive Patient Education  2017 [employer].  Fall Prevention in the Home Falls can cause injuries. They can happen to people of all ages. There are many things you can do to make your home safe and to help prevent falls. What can I do on the outside of my home? Regularly fix the edges of walkways and driveways and fix any cracks. Remove anything that might make you trip as you walk through a door, such as a raised step or threshold. Trim any bushes or trees on the path to your home. Use bright outdoor lighting. Clear any walking paths of anything that might make someone trip, such as rocks or tools. Regularly check to see if handrails are loose or broken. Make sure that both sides of any steps have handrails. Any raised decks and porches should have guardrails on the edges. Have any leaves, snow, or ice cleared regularly. Use sand or salt on  walking paths during winter. Clean up any spills in your garage right away. This includes oil or grease spills. What can I do in the bathroom? Use night lights. Install grab bars by the toilet and in the tub and shower. Do not use towel bars as grab bars. Use non-skid mats or decals in the tub or shower. If you need to sit down in the shower, use a plastic, non-slip stool. Keep the floor dry. Clean up any water that spills on the floor as soon as it happens. Remove soap buildup in the tub or shower regularly. Attach bath mats securely with double-sided non-slip rug tape. Do not have throw rugs and other things on the floor that can make you trip. What can I do in the bedroom? Use night lights. Make sure that you have a light by your bed that is easy to reach. Do not use any sheets or blankets that are too big for your bed. They should not hang down onto the floor. Have a firm chair that has side arms. You can use this for support while you get dressed. Do not have throw rugs  and other things on the floor that can make you trip. What can I do in the kitchen? Clean up any spills right away. Avoid walking on wet floors. Keep items that you use a lot in easy-to-reach places. If you need to reach something above you, use a strong step stool that has a grab bar. Keep electrical cords out of the way. Do not use floor polish or wax that makes floors slippery. If you must use wax, use non-skid floor wax. Do not have throw rugs and other things on the floor that can make you trip. What can I do with my stairs? Do not leave any items on the stairs. Make sure that there are handrails on both sides of the stairs and use them. Fix handrails that are broken or loose. Make sure that handrails are as long as the stairways. Check any carpeting to make sure that it is firmly attached to the stairs. Fix any carpet that is loose or worn. Avoid having throw rugs at the top or bottom of the stairs. If you do  have throw rugs, attach them to the floor with carpet tape. Make sure that you have a light switch at the top of the stairs and the bottom of the stairs. If you do not have them, ask someone to add them for you. What else can I do to help prevent falls? Wear shoes that: Do not have high heels. Have rubber bottoms. Are comfortable and fit you well. Are closed at the toe. Do not wear sandals. If you use a stepladder: Make sure that it is fully opened. Do not climb a closed stepladder. Make sure that both sides of the stepladder are locked into place. Ask someone to hold it for you, if possible. Clearly mark and make sure that you can see: Any grab bars or handrails. First and last steps. Where the edge of each step is. Use tools that help you move around (mobility aids) if they are needed. These include: Canes. Walkers. Scooters. Crutches. Turn on the lights when you go into a dark area. Replace any light bulbs as soon as they burn out. Set up your furniture so you have a clear path. Avoid moving your furniture around. If any of your floors are uneven, fix them. If there are any pets around you, be aware of where they are. Review your medicines with your doctor. Some medicines can make you feel dizzy. This can increase your chance of falling. Ask your doctor what other things that you can do to help prevent falls. This information is not intended to replace advice given to you by your health care provider. Make sure you discuss any questions you have with your health care provider. Document Released: 08/19/2009 Document Revised: 03/30/2016 Document Reviewed: 11/27/2014 Elsevier Interactive Patient Education  2017 [employer].

## 2023-03-27 NOTE — Progress Notes (Signed)
Subjective:   Richard Novak is a 72 y.o. male who presents for Medicare Annual/Subsequent preventive examination.  Review of Systems    I connected with  Richard Novak on 03/27/23 by a audio enabled telemedicine application and verified that I am speaking with the correct person using two identifiers. Patient Medicare AWV questionnaire was completed by the patient on 03/22/2023; I have confirmed that all information answered by patient is correct and no changes since this date.     Patient Location: Home  Provider Location: Home Office  I discussed the limitations of evaluation and management by telemedicine. The patient expressed understanding and agreed to proceed.  Cardiac Risk Factors include: Other (see comment), Risk factor comments: coroary artery diease     Objective:    Today's Vitals   03/27/23 0944  Weight: 202 lb (91.6 kg)   Body mass index is 28.17 kg/m.     03/27/2023    9:51 AM 07/14/2022    9:42 AM 04/07/2022    9:45 AM 02/17/2021    8:43 AM 07/25/2018    8:41 AM 10/04/2016    7:16 AM 10/03/2016   10:19 AM  Advanced Directives  Does Patient Have a Medical Advance Directive? Yes Yes Yes Yes Yes Yes Yes  Type of Estate agent of [place];Living will Healthcare Power of [place];Living will Healthcare Power of [place];Living will Living will;Healthcare Power of [employer] Power of [place];Living will Living will Living will  Does patient want to make changes to medical advance directive?    No - Patient declined  No - Patient declined   Copy of Healthcare Power of Attorney in Chart? No - copy requested No - copy requested No - copy requested No - copy requested No - copy requested      Current Medications (verified) Outpatient Encounter Medications as of 03/27/2023  Medication Sig   ezetimibe (ZETIA) 10 MG tablet TAKE 1 TABLET BY MOUTH EVERY DAY   fluticasone (FLONASE) 50 MCG/ACT nasal spray Place 2 sprays into both nostrils as  needed for allergies or rhinitis.   NON FORMULARY Take 1 capsule by mouth 2 (two) times daily. Herbal blend for prostate   rosuvastatin (CRESTOR) 10 MG tablet Take 1 tablet (10 mg total) by mouth daily.   vitamin C (ASCORBIC ACID) 500 MG tablet Take 500 mg by mouth 2 (two) times daily.   Vitamin D, Cholecalciferol, 1000 units CAPS Take 1,000 Units by mouth daily.    vitamin E 100 UNIT capsule Take 100 Units by mouth 2 (two) times daily.    ZINC PICOLINATE PO Take 50 mg by mouth daily.    No facility-administered encounter medications on file as of 03/27/2023.    Allergies (verified) Sulfa antibiotics   History: Past Medical History:  Diagnosis Date   Bursitis of elbow    left   Nongonococcal urethritis (NGU) due to other specified organism    Personal history of colonic adenomas 03/18/2013   Rotator cuff injury    Left   Tendinitis of left elbow    Urticaria    Past Surgical History:  Procedure Laterality Date   APPENDECTOMY  1968   CATARACT EXTRACTION, BILATERAL Bilateral    2024   COLONOSCOPY     OLECRANON BURSECTOMY Left 10/04/2016   Procedure: LEFT OLECRANON BURSECTOMY;  Surgeon: Tarry Kos, MD;  Location: [facility];  Service: Orthopedics;  Laterality: Left;   ROTATOR CUFF REPAIR  2008   Arthroscopic Repair ([facility])  Family History  Problem Relation Age of Onset   Hypertension Mother    Dementia Mother    Prostate cancer Father    Coronary artery disease Father    Cancer Father        lung cancer   Heart disease Father        CAD/CABG   Healthy Maternal Grandmother    Testicular cancer Maternal Grandfather    Healthy Paternal Grandmother    Hiatal hernia Paternal Grandfather    Diabetes Neg Hx    Heart attack Neg Hx    Colon cancer Neg Hx    Hyperlipidemia Neg Hx    Rectal cancer Neg Hx    Stomach cancer Neg Hx    Social History   Socioeconomic History   Marital status: Married    Spouse name: Not on file   Number of children: 2    Years of education: 14   Highest education level: Not on file  Occupational History   Occupation: Diplomatic Services operational officer - Engineer, site equip [employer]    Comment: Retired in March 2011    Employer: [employer]   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 Married '71. 2 daughters - '74, '77; 2 grandchildren. Marriage in [place]. Retired -but working part-time for [employer]. POA wife. Full resuscitation, short-term mechanical ventilation.      Fun: Work in the yard, fishing at [employer]   Denies religious beliefs effecting health care.          Social Determinants of Health   Financial Resource Strain: Low Risk  (03/27/2023)   Overall Financial Resource Strain (CARDIA)    Difficulty of Paying Living Expenses: Not hard at all  Food Insecurity: No Food Insecurity (03/27/2023)   Hunger Vital Sign    Worried About Running Out of Food in the Last Year: Never true    Ran Out of Food in the Last Year: Never true  Transportation Needs: No Transportation Needs (03/27/2023)   PRAPARE - Administrator, Civil Service (Medical): No    Lack of Transportation (Non-Medical): No  Physical Activity: Sufficiently Active (03/27/2023)   Exercise Vital Sign    Days of Exercise per Week: 7 days    Minutes of Exercise per Session: 30 min  Stress: No Stress Concern Present (03/27/2023)   [employer] of Occupational Health - Occupational Stress Questionnaire    Feeling of Stress : Not at all   Social Connections: Socially Integrated (03/27/2023)   Social Connection and Isolation Panel [NHANES]    Frequency of Communication with Friends and Family: More than three times a week    Frequency of Social Gatherings with Friends and Family: More than three times a week    Attends Religious Services: More than 4 times per year    Active Member of [employer] or Organizations: Yes  Attends Engineer, structural: More than 4 times per year    Marital Status: Married    Tobacco Counseling Counseling given: Yes   Clinical Intake:  Pre-visit preparation completed: Yes  Pain : No/denies pain     BMI - recorded: 28.17 Nutritional Risks: None Diabetes: No  How often do you need to have someone help you when you read instructions, pamphlets, or other written materials from your doctor or pharmacy?: 1 - Never  Diabetic?NO   Interpreter Needed?: No  Information entered by :: Fredirick Maudlin   Activities of Daily Living    03/22/2023   10:06 AM 07/14/2022    9:42 AM  In your present state of health, do you have any difficulty performing the following activities:  Hearing? 0 0  Vision? 0 0  Difficulty concentrating or making decisions? 0 0  Walking or climbing stairs? 0 0  Dressing or bathing? 0 0  Doing errands, shopping? 0 0  Preparing Food and eating ? N N  Using the Toilet? N N  In the past six months, have you accidently leaked urine? N N  Do you have problems with loss of bowel control? N N  Managing your Medications? N N  Managing your Finances? N N  Housekeeping or managing your Housekeeping? N N    Patient Care Team: Pincus Sanes, MD as PCP - General (Internal Medicine) Kathleene Hazel, MD as PCP - Cardiology (Cardiology)  Indicate any recent Medical Services you may have received from other than [facility] providers in the past year (date may be approximate).     Assessment:   This is a routine wellness examination for  Richard Novak.  Hearing/Vision screen Hearing Screening - Comments:: Denies hearing difficulties   Vision Screening - Comments:: Wears rx glasses - up to date with routine eye exams with [facility]  Dietary issues and exercise activities discussed: Current Exercise Habits: The patient has a physically strenuous job, but has no regular exercise apart from work.;Home exercise routine, Type of exercise: treadmill, Time (Minutes): 20, Frequency (Times/Week): 6, Weekly Exercise (Minutes/Week): 120   Goals Addressed             This Visit's Progress    Patient Stated   On track    Stay as healthy and as independent as possible. Enjoy life and travel to [place] with family.      Depression Screen    02/22/2023    9:51 AM 02/22/2023    9:50 AM 08/09/2022    8:49 AM 07/14/2022    9:41 AM 04/07/2022    9:47 AM 04/07/2022    9:44 AM 02/17/2022   10:00 AM  PHQ 2/9 Scores  PHQ - 2 Score 0 0 0 0 0 0 0  PHQ- 9 Score 0  0        Fall Risk    03/22/2023   10:06 AM 02/22/2023    9:50 AM 08/09/2022    8:49 AM 07/14/2022    9:39 AM 07/10/2022    3:13 PM  Fall Risk   Falls in the past year? 0 0 0 0 0  Number falls in past yr:  0 0 0   Injury with Fall? 0 0 0 0   Risk for fall due to :  No Fall Risks No Fall Risks No Fall Risks   Follow up Falls evaluation completed;Falls prevention discussed Falls evaluation completed Falls evaluation completed Falls prevention discussed     FALL RISK PREVENTION PERTAINING TO  THE HOME:  Any stairs in or around the home? No  If so, are there any without handrails? No  Home free of loose throw rugs in walkways, pet beds, electrical cords, etc? No  Adequate lighting in your home to reduce risk of falls? Yes   ASSISTIVE DEVICES UTILIZED TO PREVENT FALLS:  Life alert? No  Use of a cane, walker or w/c? No  Grab bars in the bathroom? No  Shower chair or bench in shower? Yes  Elevated toilet seat or a handicapped toilet? No   TIMED UP AND GO:  Was the test  performed?  No televisit .    Cognitive Function:        03/27/2023    9:48 AM 07/14/2022    9:42 AM  6CIT Screen  What Year? 0 points 0 points  What month? 0 points 0 points  What time? 0 points 0 points  Count back from 20 0 points 0 points  Months in reverse 0 points 0 points  Repeat phrase 0 points 0 points  Total Score 0 points 0 points    Immunizations Immunization History  Administered Date(s) Administered   Pneumococcal Conjugate-13 06/09/2015   Pneumococcal Polysaccharide-23 10/19/2016   Tdap 10/19/2016    TDAP status: Up to date  Flu Vaccine status: Declined, Education has been provided regarding the importance of this vaccine but patient still declined. Advised may receive this vaccine at local pharmacy or Health Dept. Aware to provide a copy of the vaccination record if obtained from local pharmacy or Health Dept. Verbalized acceptance and understanding.  Pneumococcal vaccine status: Up to date  Covid-19 vaccine status: Information provided on how to obtain vaccines.   Qualifies for Shingles Vaccine? Yes   Zostavax completed No   Shingrix Completed?: No.    Education has been provided regarding the importance of this vaccine. Patient has been advised to call insurance company to determine out of pocket expense if they have not yet received this vaccine. Advised may also receive vaccine at local pharmacy or Health Dept. Verbalized acceptance and understanding.  Screening Tests Health Maintenance  Topic Date Due   Zoster Vaccines- Shingrix (1 of 2) 05/24/2023 (Originally 09/10/1970)   INFLUENZA VACCINE  06/07/2023   Medicare Annual Wellness (AWV)  03/26/2024   DTaP/Tdap/Td (2 - Td or Tdap) 10/19/2026   Pneumonia Vaccine 51+ Years old  Completed   Hepatitis C Screening  Completed   HPV VACCINES  Aged Out   COLONOSCOPY (Pts 45-42yrs Insurance coverage will need to be confirmed)  Discontinued   COVID-19 Vaccine  Discontinued    Health Maintenance  There are  no preventive care reminders to display for this patient.  Colorectal cancer screening: Type of screening: Colonoscopy. Completed 12/27/22. Repeat every no repeat due to age  years  Lung Cancer Screening: (Low Dose CT Chest recommended if Age 49-80 years, 30 pack-year currently smoking OR have quit w/in 15years.) does not qualify.   Lung Cancer Screening Referral: NO   Additional Screening:  Hepatitis C Screening: does qualify; Completed 10/19/2016  Vision Screening: Recommended annual ophthalmology exams for early detection of glaucoma and other disorders of the eye. Is the patient up to date with their annual eye exam?  Yes  Who is the provider or what is the name of the office in which the patient attends annual eye exams? [facility]  If pt is not established with a provider, would they like to be referred to a provider to establish care? No .  Dental Screening: Recommended annual dental exams for proper oral hygiene  Community Resource Referral / Chronic Care Management: CRR required this visit?  No   CCM required this visit?  No      Plan:     I have personally reviewed and noted the following in the patient's chart:   Medical and social history Use of alcohol, tobacco or illicit drugs  Current medications and supplements including opioid prescriptions. Patient is not currently taking opioid prescriptions. Functional ability and status Nutritional status Physical activity Advanced directives List of other physicians Hospitalizations, surgeries, and ER visits in previous 12 months Vitals Screenings to include cognitive, depression, and falls Referrals and appointments  In addition, I have reviewed and discussed with patient certain preventive protocols, quality metrics, and best practice recommendations. A written personalized care plan for preventive services as well as general preventive health recommendations were provided to patient.     Annabell Sabal, CMA   03/27/2023   Nurse Notes: None

## 2023-04-05 ENCOUNTER — Other Ambulatory Visit: Payer: Self-pay | Admitting: Cardiovascular Disease

## 2023-04-05 NOTE — Progress Notes (Signed)
Addendum PHQ 9 same as 02/22/23.

## 2023-06-03 ENCOUNTER — Other Ambulatory Visit: Payer: Self-pay | Admitting: Cardiovascular Disease

## 2023-07-04 ENCOUNTER — Other Ambulatory Visit: Payer: Self-pay | Admitting: Cardiovascular Disease

## 2023-07-17 DIAGNOSIS — B078 Other viral warts: Secondary | ICD-10-CM | POA: Diagnosis not present

## 2023-08-03 ENCOUNTER — Other Ambulatory Visit: Payer: Self-pay | Admitting: Cardiovascular Disease

## 2023-08-06 ENCOUNTER — Ambulatory Visit: Payer: PPO | Admitting: Cardiovascular Disease

## 2023-08-06 ENCOUNTER — Encounter: Payer: Self-pay | Admitting: Cardiovascular Disease

## 2023-08-13 ENCOUNTER — Encounter: Payer: Self-pay | Admitting: Cardiovascular Disease

## 2023-08-13 ENCOUNTER — Ambulatory Visit: Payer: PPO | Attending: Cardiovascular Disease | Admitting: Cardiovascular Disease

## 2023-08-13 VITALS — BP 118/72 | HR 92 | Ht 71.0 in | Wt 195.2 lb

## 2023-08-13 DIAGNOSIS — I2510 Atherosclerotic heart disease of native coronary artery without angina pectoris: Secondary | ICD-10-CM | POA: Diagnosis not present

## 2023-08-13 DIAGNOSIS — E782 Mixed hyperlipidemia: Secondary | ICD-10-CM

## 2023-08-13 DIAGNOSIS — I6523 Occlusion and stenosis of bilateral carotid arteries: Secondary | ICD-10-CM | POA: Diagnosis not present

## 2023-08-13 NOTE — Patient Instructions (Signed)

## 2023-08-13 NOTE — Progress Notes (Signed)
Chief Complaint  Patient presents with   Follow-up    CAD   History of Present Illness: 72 yo male with history of CAD, mild carotid artery disease who is here today for cardiac follow up. I saw him as a new consult for the evaluation of chest pain in May 2021. EKG 03/01/20 in primary care with sinus rhythm and no ischemic changes. Carotid artery dopplers September 2023 with mild disease. Cardiac CTA June 2021 with moderate LAD stenosis, mild RCA stenosis. No flow limiting lesions by FFR. Echo May 2021 with LVEF=55-60%, no valve disease.   He is here today for follow up. The patient denies any chest pain, dyspnea, palpitations, lower extremity edema, orthopnea, PND, dizziness, near syncope or syncope. He is exercising 5 days per week on the treadmill and going for 2 miles.   Primary Care Physician: Pincus Sanes, MD  Past Medical History:  Diagnosis Date   Bursitis of elbow    left   Nongonococcal urethritis (NGU) due to other specified organism    Personal history of colonic adenomas 03/18/2013   Rotator cuff injury    Left   Tendinitis of left elbow    Urticaria     Past Surgical History:  Procedure Laterality Date   APPENDECTOMY  1968   CATARACT EXTRACTION, BILATERAL Bilateral    2024   COLONOSCOPY     OLECRANON BURSECTOMY Left 10/04/2016   Procedure: LEFT OLECRANON BURSECTOMY;  Surgeon: Tarry Kos, MD;  Location: [facility];  Service: Orthopedics;  Laterality: Left;   ROTATOR CUFF REPAIR  2008   Arthroscopic Repair ([facility])    Current Outpatient Medications  Medication Sig Dispense Refill   ezetimibe (ZETIA) 10 MG tablet TAKE 1 TABLET BY MOUTH EVERY DAY 90 tablet 0   fluticasone (FLONASE) 50 MCG/ACT nasal spray Place 2 sprays into both nostrils as needed for allergies or rhinitis.     NON FORMULARY Take 1 capsule by mouth 2 (two) times daily. Herbal blend for prostate     rosuvastatin (CRESTOR) 10 MG tablet TAKE 1 TABLET BY MOUTH EVERY DAY 90 tablet 0    vitamin C (ASCORBIC ACID) 500 MG tablet Take 500 mg by mouth 2 (two) times daily.     Vitamin D, Cholecalciferol, 1000 units CAPS Take 1,000 Units by mouth daily.      vitamin E 100 UNIT capsule Take 100 Units by mouth 2 (two) times daily.      ZINC PICOLINATE PO Take 50 mg by mouth daily.      No current facility-administered medications for this visit.    Allergies  Allergen Reactions   Sulfa Antibiotics Nausea Only    Makes me gag!    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 Married '71. 2 daughters - '74, '77; 2 grandchildren. Marriage in [place]. Retired -but working part-time for [employer]. POA wife. Full resuscitation, short-term mechanical ventilation.      Fun: Work in the yard, fishing at [employer]   Denies religious beliefs effecting health care.          Social Determinants of Health   Financial Resource Strain: Low Risk  (03/27/2023)   Overall Financial Resource Strain (CARDIA)    Difficulty of Paying Living Expenses: Not hard at all  Food Insecurity: No Food Insecurity (03/27/2023)   Hunger Vital Sign     Worried About Running Out of Food in the Last Year: Never true    Ran Out of Food in the Last Year: Never true  Transportation Needs: No Transportation Needs (03/27/2023)   PRAPARE - Administrator, Civil Service (Medical): No    Lack of Transportation (Non-Medical): No  Physical Activity: Sufficiently Active (03/27/2023)   Exercise Vital Sign    Days of Exercise per Week: 7 days    Minutes of Exercise per Session: 30 min  Stress: No Stress Concern Present (03/27/2023)   [employer] of Occupational Health - Occupational Stress Questionnaire    Feeling of Stress : Not at all  Social Connections: Socially Integrated (03/27/2023)   Social Connection and Isolation Panel [NHANES]    Frequency of Communication with Friends and Family: More than three times a week    Frequency of Social Gatherings with Friends and Family: More than three times a week    Attends Religious Services: More than 4 times per year    Active Member of [employer] or Organizations: Yes    Attends Engineer, structural: More than 4 times per year    Marital Status: Married  Catering manager Violence: Not At Risk (03/27/2023)   Humiliation, Afraid, Rape, and Kick questionnaire    Fear of Current or Ex-Partner: No    Emotionally Abused: No    Physically Abused: No    Sexually Abused: No    Family History  Problem Relation Age of Onset   Hypertension Mother    Dementia Mother    Prostate cancer Father    Coronary artery disease Father    Cancer Father        lung cancer   Heart disease Father        CAD/CABG   Healthy Maternal Grandmother    Testicular cancer Maternal Grandfather    Healthy Paternal Grandmother    Hiatal hernia Paternal Grandfather    Diabetes Neg Hx    Heart attack Neg Hx    Colon cancer Neg Hx    Hyperlipidemia Neg Hx    Rectal cancer Neg Hx    Stomach cancer Neg Hx     Review of Systems:  As stated in the HPI and otherwise negative.   BP 118/72   Pulse 92   Ht 5'  11" (1.803 m)   Wt 88.5 kg   SpO2 97%   BMI 27.22 kg/m   Physical Examination: General: Well developed, well nourished, NAD  HEENT: OP clear, mucus membranes moist  SKIN: warm, dry. No rashes. Neuro: No  focal deficits  Musculoskeletal: Muscle strength 5/5 all ext  Psychiatric: Mood and affect normal  Neck: No JVD, no carotid bruits, no thyromegaly, no lymphadenopathy.  Lungs:Clear bilaterally, no wheezes, rhonci, crackles Cardiovascular: Regular rate and rhythm. No murmurs, gallops or rubs. Abdomen:Soft. Bowel sounds present. Non-tender.  Extremities: No lower extremity edema. Pulses are 2 + in the bilateral DP/PT.  EKG:  EKG is ordered today. The ekg ordered today demonstrates  EKG Interpretation Date/Time:  Monday August 13 2023 15:46:17 EDT Ventricular Rate:  92 PR Interval:  134 QRS Duration:  86 QT Interval:  354 QTC Calculation: 437 R Axis:   88  Text Interpretation: Normal sinus rhythm Normal ECG No previous ECGs available Confirmed by Verne Carrow [phone]) on 08/13/2023 3:53:44 PM    Echo 04/02/20: 1. Left ventricular ejection fraction, by estimation, is 55 to 60%. The  left ventricle has normal function. The left ventricle has no regional  wall motion abnormalities. Left ventricular diastolic parameters are  consistent with Grade I diastolic  dysfunction (impaired relaxation). The average left ventricular global  longitudinal strain is -19.0 %. The global longitudinal strain is normal.   2. Right ventricular systolic function is normal. The right ventricular  size is normal.   3. The mitral valve is normal in structure. No evidence of mitral valve  regurgitation. No evidence of mitral stenosis.   4. The aortic valve is normal in structure. Aortic valve regurgitation is  not visualized. No aortic stenosis is present.   5. The inferior vena cava is normal in size with greater than 50%  respiratory variability, suggesting right atrial pressure of 3 mmHg.    Recent Labs: 11/24/2022: Hemoglobin 14.6; Platelets 222.0 02/22/2023: ALT 26; BUN 26; Creatinine, Ser 1.27; Potassium 4.6; Sodium 135; TSH 0.92   Lipid Panel    Component Value Date/Time   CHOL 92 02/22/2023 1030   CHOL 101 07/07/2022 0956   TRIG 145.0 02/22/2023 1030   TRIG 80 09/26/2006 1006   HDL 41.70 02/22/2023 1030   HDL 46 07/07/2022 0956   CHOLHDL 2 02/22/2023 1030   VLDL 29.0 02/22/2023 1030   LDLCALC 21 02/22/2023 1030   LDLCALC 41 07/07/2022 0956   LDLDIRECT 128.0 02/13/2020 1501     Wt Readings from Last 3 Encounters:  08/13/23 88.5 kg  03/27/23 91.6 kg  02/22/23 91.6 kg     Assessment and Plan:   1. CAD without angina: Moderate CAD by cardiac CTA May 2021. LV systolic function is normal.  No chest pain suggestive of angina.Cntinue ASA, Crestor and Zetia.   2. Hyperlipidemia: LDL 21 in April 2024. Continue Crestor and Zetia.  3. Carotid artery disease: Mild by dopplers in June 2023.    Labs/ tests ordered today include:   Orders Placed This Encounter  Procedures   EKG 12-Lead   Disposition:   F/U with me in 12 months   Signed, Verne Carrow, MD 08/13/2023 4:01 PM    [facility] [address] [place], [place], [place]  [address] Phone: [phone]; Fax: [phone]

## 2023-09-05 ENCOUNTER — Other Ambulatory Visit: Payer: Self-pay | Admitting: Cardiovascular Disease

## 2023-10-02 ENCOUNTER — Ambulatory Visit: Payer: PPO | Admitting: Physician Assistant

## 2023-10-09 ENCOUNTER — Ambulatory Visit: Payer: PPO | Admitting: Physician Assistant

## 2023-11-03 ENCOUNTER — Other Ambulatory Visit: Payer: Self-pay | Admitting: Cardiovascular Disease

## 2024-01-01 LAB — HM DIABETES EYE EXAM

## 2024-02-25 DIAGNOSIS — Z8042 Family history of malignant neoplasm of prostate: Secondary | ICD-10-CM | POA: Diagnosis not present

## 2024-02-27 ENCOUNTER — Encounter: Payer: PPO | Admitting: Internal Medicine

## 2024-02-28 ENCOUNTER — Ambulatory Visit

## 2024-02-28 VITALS — Ht 70.0 in | Wt 195.0 lb

## 2024-02-28 DIAGNOSIS — Z0000 Encounter for general adult medical examination without abnormal findings: Secondary | ICD-10-CM

## 2024-02-28 NOTE — Progress Notes (Signed)
 Subjective:   Richard Novak is a 73 y.o. who presents for a Medicare Wellness preventive visit.  Visit Complete: Virtual I connected with  Richard Novak on 02/28/24 by a audio enabled telemedicine application and verified that I am speaking with the correct person using two identifiers.  Patient Location: Home  Provider Location: Office/Clinic  I discussed the limitations of evaluation and management by telemedicine. The patient expressed understanding and agreed to proceed.  Vital Signs: Because this visit was a virtual/telehealth visit, some criteria may be missing or patient reported. Any vitals not documented were not able to be obtained and vitals that have been documented are patient reported.  VideoDeclined- This patient declined Librarian, academic. Therefore the visit was completed with audio only.  Persons Participating in Visit: Patient.  AWV Questionnaire: No: Patient Medicare AWV questionnaire was not completed prior to this visit.  Cardiac Risk Factors include: advanced age (>68men, >13 women);dyslipidemia;male gender     Objective:    Today's Vitals   02/28/24 0854  Weight: 195 lb (88.5 kg)  Height: 5\' 10"  (1.778 m)   Body mass index is 27.98 kg/m.     02/28/2024    8:53 AM 03/27/2023    9:51 AM 07/14/2022    9:42 AM 04/07/2022    9:45 AM 02/17/2021    8:43 AM 07/25/2018    8:41 AM 10/04/2016    7:16 AM  Advanced Directives  Does Patient Have a Medical Advance Directive? Yes Yes Yes Yes Yes Yes Yes  Type of Estate agent of [place];Living will Healthcare Power of [place];Living will Healthcare Power of [facility];Living will Healthcare Power of [place];Living will Living will;Healthcare Power of [employer] Power of [place];Living will Living will  Does patient want to make changes to medical advance directive?     No - Patient declined  No - Patient declined  Copy of Healthcare Power of Attorney in  Chart? No - copy requested No - copy requested No - copy requested No - copy requested No - copy requested No - copy requested     Current Medications (verified) Outpatient Encounter Medications as of 02/28/2024  Medication Sig   ezetimibe  (ZETIA ) 10 MG tablet TAKE 1 TABLET BY MOUTH EVERY DAY   fluticasone  (FLONASE ) 50 MCG/ACT nasal spray Place 2 sprays into both nostrils as needed for allergies or rhinitis.   NON FORMULARY Take 1 capsule by mouth 2 (two) times daily. Herbal blend for prostate   rosuvastatin  (CRESTOR ) 10 MG tablet TAKE 1 TABLET BY MOUTH EVERY DAY   vitamin C (ASCORBIC ACID) 500 MG tablet Take 500 mg by mouth 2 (two) times daily.   Vitamin D, Cholecalciferol, 1000 units CAPS Take 1,000 Units by mouth daily.    vitamin E 100 UNIT capsule Take 100 Units by mouth 2 (two) times daily.    ZINC PICOLINATE PO Take 50 mg by mouth daily.    No facility-administered encounter medications on file as of 02/28/2024.    Allergies (verified) Sulfa  antibiotics   History: Past Medical History:  Diagnosis Date   Bursitis of elbow    left   Nongonococcal urethritis (NGU) due to other specified organism    Personal history of colonic adenomas 03/18/2013   Rotator cuff injury    Left   Tendinitis of left elbow    Urticaria    Past Surgical History:  Procedure Laterality Date   APPENDECTOMY  1968   CATARACT EXTRACTION, BILATERAL Bilateral    2024  COLONOSCOPY     OLECRANON BURSECTOMY Left 10/04/2016   Procedure: LEFT OLECRANON BURSECTOMY;  Surgeon: Wes Hamman, MD;  Location: [facility];  Service: Orthopedics;  Laterality: Left;   ROTATOR CUFF REPAIR  2008   Arthroscopic Repair ([facility])   Family History  Problem Relation Age of Onset   Hypertension Mother    Dementia Mother    Prostate cancer Father    Coronary artery disease Father    Cancer Father        lung cancer   Heart disease Father        CAD/CABG   Healthy Maternal Grandmother    Testicular  cancer Maternal Grandfather    Healthy Paternal Grandmother    Hiatal hernia Paternal Grandfather    Diabetes Neg Hx    Heart attack Neg Hx    Colon cancer Neg Hx    Hyperlipidemia Neg Hx    Rectal cancer Neg Hx    Stomach cancer Neg Hx    Social History   Socioeconomic History   Marital status: Married    Spouse name: Not on file   Number of children: 2   Years of education: 14   Highest education level: Not on file  Occupational History   Occupation: Diplomatic Services operational officer - Engineer, site equip [employer]    Comment: Retired in March 2011    Employer: [employer]   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 Married '71. 2 daughters - '74, '77; 2 grandchildren. Marriage in [place]. Retired -but working part-time for [employer]. POA wife. Full resuscitation, short-term mechanical ventilation.      Fun: Work in the yard, fishing at [employer]   Denies religious beliefs effecting health care.          Social Drivers of Corporate investment banker Strain: Low Risk  (02/28/2024)   Overall Financial Resource Strain (CARDIA)    Difficulty of Paying Living Expenses: Not hard at all  Food Insecurity: No Food Insecurity (02/28/2024)   Hunger Vital Sign    Worried About Running Out of Food in the Last Year: Never true    Ran Out of Food in the Last Year: Never true  Transportation Needs: No Transportation Needs (02/28/2024)   PRAPARE -  Administrator, Civil Service (Medical): No    Lack of Transportation (Non-Medical): No  Physical Activity: Insufficiently Active (02/28/2024)   Exercise Vital Sign    Days of Exercise per Week: 4 days    Minutes of Exercise per Session: 30 min  Stress: No Stress Concern Present (02/28/2024)   [employer] of Occupational Health - Occupational Stress Questionnaire    Feeling of Stress : Not at all  Social Connections: Socially Integrated (02/28/2024)   Social Connection and Isolation Panel [NHANES]    Frequency of  Communication with Friends and Family: More than three times a week    Frequency of Social Gatherings with Friends and Family: More than three times a week    Attends Religious Services: More than 4 times per year    Active Member of [employer] or Organizations: Yes    Attends Engineer, structural: More than 4 times per year    Marital Status: Married    Tobacco Counseling Counseling given: No    Clinical Intake:  Pre-visit preparation completed: Yes  Pain : No/denies pain     BMI - recorded: 27.98 Nutritional Status: BMI 25 -29 Overweight Nutritional Risks: None Diabetes: No  Lab Results  Component Value Date   HGBA1C 6.2 02/22/2023   HGBA1C 6.0 02/17/2022   HGBA1C 5.7 02/10/2021     How often do you need to have someone help you when you read instructions, pamphlets, or other written materials from your doctor or pharmacy?: 1 - Never  Interpreter Needed?: No  Information entered by :: Kandy Orris, CMA   Activities of Daily Living     02/28/2024    8:56 AM 03/22/2023   10:06 AM  In your present state of health, do you have any difficulty performing the following activities:  Hearing? 0 0  Vision? 0 0  Difficulty concentrating or making decisions? 0 0  Walking or climbing stairs? 0 0  Dressing or bathing? 0 0  Doing errands, shopping? 0 0  Preparing Food and eating ? N N  Using the Toilet? N N  In the past six months, have  you accidently leaked urine? N N  Do you have problems with loss of bowel control? N N  Managing your Medications? N N  Managing your Finances? N N  Housekeeping or managing your Housekeeping? N N    Patient Care Team: Colene Dauphin, MD as PCP - General (Internal Medicine) Odie Benne, MD as PCP - Cardiology (Cardiology) Dema Filler, MD as Consulting Physician (Ophthalmology)  Indicate any recent Medical Services you may have received from other than [facility] providers in the past year (date may be approximate).     Assessment:   This is a routine wellness examination for Richard Novak.  Hearing/Vision screen Hearing Screening - Comments:: Denies hearing difficulties   Vision Screening - Comments:: Wears rx glasses - up to date with routine eye exams with Dr Juanito Norma   Goals Addressed               This Visit's Progress     Patient Stated (pt-stated)        Patient stated he plans to maintain his activities and staying active.       Depression Screen     02/28/2024    8:59 AM 02/22/2023    9:51 AM 02/22/2023    9:50 AM 08/09/2022    8:49 AM 07/14/2022    9:41 AM 04/07/2022    9:47 AM 04/07/2022    9:44 AM  PHQ 2/9 Scores  PHQ - 2 Score 0 0 0 0 0 0 0  PHQ- 9 Score 0 0  0       Fall Risk     02/28/2024    9:00 AM 03/22/2023   10:06 AM 02/22/2023    9:50 AM 08/09/2022    8:49 AM 07/14/2022    9:39 AM  Fall Risk   Falls in the past year? 0 0 0 0 0  Number falls in past yr: 0  0 0 0  Injury with Fall? 0 0 0 0 0  Risk for fall due to : No Fall Risks  No Fall Risks No Fall Risks No Fall Risks  Follow up Falls prevention discussed;Falls evaluation completed Falls evaluation completed;Falls prevention discussed Falls evaluation completed Falls evaluation completed Falls prevention discussed    MEDICARE RISK AT HOME:  Medicare Risk at Home Any stairs in or around the home?: No If so, are there any without handrails?: No Home free of loose throw rugs in walkways, pet  beds, electrical cords, etc?: Yes Adequate lighting in your home to reduce risk of falls?: Yes Life alert?: No Use of a cane, walker or w/c?: No Grab bars in the bathroom?: Yes Shower chair or bench in shower?: Yes Elevated toilet seat or a handicapped toilet?: Yes  TIMED UP AND GO:  Was the test performed?  No  Cognitive Function: 6CIT completed        02/28/2024    9:01 AM 03/27/2023    9:48 AM 07/14/2022    9:42 AM  6CIT Screen  What Year? 0 points 0 points 0 points  What month? 0 points 0 points 0 points  What time? 0 points 0 points 0 points  Count back from 20 0 points 0 points 0 points  Months in reverse 0 points 0 points 0 points  Repeat phrase 2 points 0 points 0 points  Total Score 2 points 0 points 0 points    Immunizations Immunization History  Administered Date(s) Administered   Pneumococcal Conjugate-13 06/09/2015   Pneumococcal Polysaccharide-23 10/19/2016   Tdap 10/19/2016    Screening Tests Health Maintenance  Topic Date Due   Zoster Vaccines- Shingrix (1 of 2) Never done   INFLUENZA VACCINE  06/06/2024   Medicare Annual Wellness (AWV)  02/27/2025   DTaP/Tdap/Td (2 - Td or Tdap) 10/19/2026   Pneumonia Vaccine 56+ Years old  Completed   Hepatitis C Screening  Completed   HPV VACCINES  Aged Out   Meningococcal B Vaccine  Aged Out   Colonoscopy  Discontinued   COVID-19 Vaccine  Discontinued    Health Maintenance  Health Maintenance Due  Topic Date Due   Zoster Vaccines- Shingrix (1 of 2) Never done   Health Maintenance Items Addressed: 02/28/2024   Additional Screening:  Vision Screening: Recommended annual ophthalmology exams for early detection of glaucoma and other disorders of the eye.  Dental Screening: Recommended annual dental exams for proper oral hygiene  Community Resource Referral / Chronic Care Management: CRR required this visit?  No   CCM required this visit?  No     Plan:     I have personally reviewed and noted the  following in the patient's chart:   Medical and social history Use of alcohol, tobacco or illicit drugs  Current medications and supplements including opioid prescriptions. Patient is not currently taking opioid prescriptions. Functional ability and status Nutritional status Physical activity Advanced directives List of other physicians Hospitalizations, surgeries, and ER visits in previous 12 months Vitals Screenings to include cognitive, depression, and falls Referrals and appointments  In addition, I have reviewed and discussed with patient certain preventive protocols, quality metrics, and best practice recommendations. A written personalized care plan for preventive services as well as general preventive health recommendations were provided to patient.     Patria Bookbinder, CMA   02/28/2024   After Visit Summary: (MyChart) Due to this being a telephonic visit, the after visit summary with patients personalized plan was offered to patient  via MyChart   Notes: Nothing significant to report at this time.

## 2024-02-28 NOTE — Patient Instructions (Addendum)
 [name] , Thank you for taking time to come for your Medicare Wellness Visit. I appreciate your ongoing commitment to your health goals. Please review the following plan we discussed and let me know if I can assist you in the future.   Referrals/Orders/Follow-Ups/Clinician Recommendations: Aim for 30 minutes of exercise or brisk walking, 6-8 glasses of water, and 5 servings of fruits and vegetables each day. Educated and advised on getting the Shingles vaccines in 2025.    This is a list of the screening recommended for you and due dates:  Health Maintenance  Topic Date Due   Zoster (Shingles) Vaccine (1 of 2) Never done   Flu Shot  06/06/2024   Medicare Annual Wellness Visit  02/27/2025   DTaP/Tdap/Td vaccine (2 - Td or Tdap) 10/19/2026   Pneumonia Vaccine  Completed   Hepatitis C Screening  Completed   HPV Vaccine  Aged Out   Meningitis B Vaccine  Aged Out   Colon Cancer Screening  Discontinued   COVID-19 Vaccine  Discontinued    Advanced directives: (Copy Requested) Please bring a copy of your health care power of attorney and living will to the office to be added to your chart at your convenience. You can mail to [facility] [id number] [place], [place] [address] or email to [email or web address]  Next Medicare Annual Wellness Visit scheduled for next year: Yes

## 2024-02-29 DIAGNOSIS — H26493 Other secondary cataract, bilateral: Secondary | ICD-10-CM | POA: Diagnosis not present

## 2024-02-29 DIAGNOSIS — H35373 Puckering of macula, bilateral: Secondary | ICD-10-CM | POA: Diagnosis not present

## 2024-03-03 ENCOUNTER — Encounter: Payer: Self-pay | Admitting: Internal Medicine

## 2024-03-03 DIAGNOSIS — N528 Other male erectile dysfunction: Secondary | ICD-10-CM | POA: Diagnosis not present

## 2024-03-03 DIAGNOSIS — N401 Enlarged prostate with lower urinary tract symptoms: Secondary | ICD-10-CM | POA: Diagnosis not present

## 2024-03-03 DIAGNOSIS — N3943 Post-void dribbling: Secondary | ICD-10-CM | POA: Diagnosis not present

## 2024-03-03 NOTE — Patient Instructions (Addendum)
 Blood work was ordered.       Medications changes include :   None    A referral was ordered and someone will call you to schedule an appointment.     Return in about 1 year (around 03/04/2025) for Physical Exam.   Health Maintenance, Male Adopting a healthy lifestyle and getting preventive care are important in promoting health and wellness. Ask your health care provider about: The right schedule for you to have regular tests and exams. Things you can do on your own to prevent diseases and keep yourself healthy. What should I know about diet, weight, and exercise? Eat a healthy diet  Eat a diet that includes plenty of vegetables, fruits, low-fat dairy products, and lean protein. Do not eat a lot of foods that are high in solid fats, added sugars, or sodium. Maintain a healthy weight Body mass index (BMI) is a measurement that can be used to identify possible weight problems. It estimates body fat based on height and weight. Your health care provider can help determine your BMI and help you achieve or maintain a healthy weight. Get regular exercise Get regular exercise. This is one of the most important things you can do for your health. Most adults should: Exercise for at least 150 minutes each week. The exercise should increase your heart rate and make you sweat (moderate-intensity exercise). Do strengthening exercises at least twice a week. This is in addition to the moderate-intensity exercise. Spend less time sitting. Even light physical activity can be beneficial. Watch cholesterol and blood lipids Have your blood tested for lipids and cholesterol at 75 years of age, then have this test every 5 years. You may need to have your cholesterol levels checked more often if: Your lipid or cholesterol levels are high. You are older than 75 years of age. You are at high risk for heart disease. What should I know about cancer screening? Many types of cancers can be detected  early and may often be prevented. Depending on your health history and family history, you may need to have cancer screening at various ages. This may include screening for: Colorectal cancer. Prostate cancer. Skin cancer. Lung cancer. What should I know about heart disease, diabetes, and high blood pressure? Blood pressure and heart disease High blood pressure causes heart disease and increases the risk of stroke. This is more likely to develop in people who have high blood pressure readings or are overweight. Talk with your health care provider about your target blood pressure readings. Have your blood pressure checked: Every 3-5 years if you are 59-66 years of age. Every year if you are 81 years old or older. If you are between the ages of 65 and 30 and are a current or former smoker, ask your health care provider if you should have a one-time screening for abdominal aortic aneurysm (AAA). Diabetes Have regular diabetes screenings. This checks your fasting blood sugar level. Have the screening done: Once every three years after age 61 if you are at a normal weight and have a low risk for diabetes. More often and at a younger age if you are overweight or have a high risk for diabetes. What should I know about preventing infection? Hepatitis B If you have a higher risk for hepatitis B, you should be screened for this virus. Talk with your health care provider to find out if you are at risk for hepatitis B infection. Hepatitis C Blood testing is recommended for:  Everyone born from [birth year] through 1967. Anyone with known risk factors for hepatitis C. Sexually transmitted infections (STIs) You should be screened each year for STIs, including gonorrhea and chlamydia, if: You are sexually active and are younger than 75 years of age. You are older than 75 years of age and your health care provider tells you that you are at risk for this type of infection. Your sexual activity has changed since  you were last screened, and you are at increased risk for chlamydia or gonorrhea. Ask your health care provider if you are at risk. Ask your health care provider about whether you are at high risk for HIV. Your health care provider may recommend a prescription medicine to help prevent HIV infection. If you choose to take medicine to prevent HIV, you should first get tested for HIV. You should then be tested every 3 months for as long as you are taking the medicine. Follow these instructions at home: Alcohol use Do not drink alcohol if your health care provider tells you not to drink. If you drink alcohol: Limit how much you have to 0-2 drinks a day. Know how much alcohol is in your drink. In the [place] one drink equals one 12 oz bottle of beer (355 mL), one 5 oz glass of wine (148 mL), or one 1 oz glass of hard liquor (44 mL). Lifestyle Do not use any products that contain nicotine or tobacco. These products include cigarettes, chewing tobacco, and vaping devices, such as e-cigarettes. If you need help quitting, ask your health care provider. Do not use street drugs. Do not share needles. Ask your health care provider for help if you need support or information about quitting drugs. General instructions Schedule regular health, dental, and eye exams. Stay current with your vaccines. Tell your health care provider if: You often feel depressed. You have ever been abused or do not feel safe at home. Summary Adopting a healthy lifestyle and getting preventive care are important in promoting health and wellness. Follow your health care provider's instructions about healthy diet, exercising, and getting tested or screened for diseases. Follow your health care provider's instructions on monitoring your cholesterol and blood pressure. This information is not intended to replace advice given to you by your health care provider. Make sure you discuss any questions you have with your health care  provider. Document Revised: 03/14/2021 Document Reviewed: 03/14/2021 Elsevier Patient Education  2024 [employer].

## 2024-03-03 NOTE — Progress Notes (Unsigned)
 Subjective:    Patient ID: Richard Novak, male    DOB: 06/20/1951, 73 y.o.   MRN: [id number]     HPI Richard Novak is here for a physical exam and his chronic medical problems.    Nothing new.     Medications and allergies reviewed with patient and updated if appropriate.  Current Outpatient Medications on File Prior to Visit  Medication Sig Dispense Refill   aspirin EC 81 MG tablet Take 81 mg by mouth daily. Swallow whole.     ezetimibe  (ZETIA ) 10 MG tablet TAKE 1 TABLET BY MOUTH EVERY DAY 90 tablet 3   fluticasone  (FLONASE ) 50 MCG/ACT nasal spray Place 2 sprays into both nostrils as needed for allergies or rhinitis.     NON FORMULARY Take 1 capsule by mouth 2 (two) times daily. Herbal blend for prostate     rosuvastatin  (CRESTOR ) 10 MG tablet TAKE 1 TABLET BY MOUTH EVERY DAY 90 tablet 2   vitamin C (ASCORBIC ACID) 500 MG tablet Take 500 mg by mouth 2 (two) times daily.     Vitamin D, Cholecalciferol, 1000 units CAPS Take 1,000 Units by mouth daily.      vitamin E 100 UNIT capsule Take 100 Units by mouth 2 (two) times daily.      ZINC PICOLINATE PO Take 50 mg by mouth daily.      No current facility-administered medications on file prior to visit.    Review of Systems  Constitutional:  Negative for fever.  Eyes:  Negative for visual disturbance.  Respiratory:  Negative for cough, shortness of breath and wheezing.   Cardiovascular:  Negative for chest pain, palpitations and leg swelling.  Gastrointestinal:  Negative for abdominal pain, blood in stool, constipation and diarrhea.       No gerd  Genitourinary:  Negative for difficulty urinating (a little dribbling - sees urology) and dysuria.  Musculoskeletal:  Negative for arthralgias (no pain - stiffness only) and back pain.  Skin:  Negative for rash.  Neurological:  Negative for light-headedness and headaches.  Psychiatric/Behavioral:  Negative for dysphoric mood. The patient is not nervous/anxious.        Objective:    Vitals:   03/04/24 1337  BP: 122/78  Pulse: 78  Temp: 98.4 F (36.9 C)  SpO2: 94%   Filed Weights   03/04/24 1337  Weight: 178 lb (80.7 kg)   Body mass index is 25.54 kg/m.  BP Readings from Last 3 Encounters:  03/04/24 122/78  08/13/23 118/72  02/22/23 110/70    Wt Readings from Last 3 Encounters:  03/04/24 178 lb (80.7 kg)  02/28/24 195 lb (88.5 kg)  08/13/23 195 lb 3.2 oz (88.5 kg)      Physical Exam Constitutional: He appears well-developed and well-nourished. No distress.  HENT:  Head: Normocephalic and atraumatic.  Right Ear: External ear normal.  Left Ear: External ear normal.  Normal ear canals and TM b/l  Mouth/Throat: Oropharynx is clear and moist. Eyes: Conjunctivae and EOM are normal.  Neck: Neck supple. No tracheal deviation present. No thyromegaly present.  No carotid bruit  Cardiovascular: Normal rate, regular rhythm, normal heart sounds and intact distal pulses.   No murmur heard.  No lower extremity edema. Pulmonary/Chest: Effort normal and breath sounds normal. No respiratory distress. He has no wheezes. He has no rales.  Abdominal: Soft. He exhibits no distension. There is no tenderness.  Genitourinary: deferred  Lymphadenopathy:   He has no cervical adenopathy.  Skin: Skin is warm  and dry. He is not diaphoretic.  Psychiatric: He has a normal mood and affect. His behavior is normal.         Assessment & Plan:   Physical exam: Screening blood work  ordered Exercise   slacked off regularly - will get better - doing a lot yard work Weight  is good Substance abuse   none   Reviewed recommended immunizations.   Health Maintenance  Topic Date Due   Zoster Vaccines- Shingrix (1 of 2) 06/03/2024 (Originally 09/10/1970)   INFLUENZA VACCINE  06/06/2024   Medicare Annual Wellness (AWV)  02/27/2025   DTaP/Tdap/Td (2 - Td or Tdap) 10/19/2026   Pneumonia Vaccine 2+ Years old  Completed   Hepatitis C Screening  Completed   HPV VACCINES   Aged Out   Meningococcal B Vaccine  Aged Out   Colonoscopy  Discontinued   COVID-19 Vaccine  Discontinued     See Problem List for Assessment and Plan of chronic medical problems.

## 2024-03-04 ENCOUNTER — Ambulatory Visit (INDEPENDENT_AMBULATORY_CARE_PROVIDER_SITE_OTHER): Admitting: Internal Medicine

## 2024-03-04 ENCOUNTER — Encounter: Payer: Self-pay | Admitting: Internal Medicine

## 2024-03-04 VITALS — BP 122/78 | HR 78 | Temp 98.4°F | Ht 70.0 in | Wt 178.0 lb

## 2024-03-04 DIAGNOSIS — E782 Mixed hyperlipidemia: Secondary | ICD-10-CM | POA: Diagnosis not present

## 2024-03-04 DIAGNOSIS — I6521 Occlusion and stenosis of right carotid artery: Secondary | ICD-10-CM | POA: Diagnosis not present

## 2024-03-04 DIAGNOSIS — I2510 Atherosclerotic heart disease of native coronary artery without angina pectoris: Secondary | ICD-10-CM | POA: Diagnosis not present

## 2024-03-04 DIAGNOSIS — R7303 Prediabetes: Secondary | ICD-10-CM | POA: Diagnosis not present

## 2024-03-04 DIAGNOSIS — Z0000 Encounter for general adult medical examination without abnormal findings: Secondary | ICD-10-CM

## 2024-03-04 LAB — CBC WITH DIFFERENTIAL/PLATELET
Basophils Absolute: 0.1 10*3/uL (ref 0.0–0.1)
Basophils Relative: 1 % (ref 0.0–3.0)
Eosinophils Absolute: 0.2 10*3/uL (ref 0.0–0.7)
Eosinophils Relative: 3.8 % (ref 0.0–5.0)
HCT: 44 % (ref 39.0–52.0)
Hemoglobin: 14.6 g/dL (ref 13.0–17.0)
Lymphocytes Relative: 26.3 % (ref 12.0–46.0)
Lymphs Abs: 1.7 10*3/uL (ref 0.7–4.0)
MCHC: 33.2 g/dL (ref 30.0–36.0)
MCV: 94.7 fl (ref 78.0–100.0)
Monocytes Absolute: 0.7 10*3/uL (ref 0.1–1.0)
Monocytes Relative: 10.4 % (ref 3.0–12.0)
Neutro Abs: 3.7 10*3/uL (ref 1.4–7.7)
Neutrophils Relative %: 58.5 % (ref 43.0–77.0)
Platelets: 203 10*3/uL (ref 150.0–400.0)
RBC: 4.65 Mil/uL (ref 4.22–5.81)
RDW: 13.6 % (ref 11.5–15.5)
WBC: 6.3 10*3/uL (ref 4.0–10.5)

## 2024-03-04 LAB — COMPREHENSIVE METABOLIC PANEL WITH GFR
ALT: 24 U/L (ref 0–53)
AST: 24 U/L (ref 0–37)
Albumin: 4.4 g/dL (ref 3.5–5.2)
Alkaline Phosphatase: 47 U/L (ref 39–117)
BUN: 18 mg/dL (ref 6–23)
CO2: 29 meq/L (ref 19–32)
Calcium: 9.7 mg/dL (ref 8.4–10.5)
Chloride: 103 meq/L (ref 96–112)
Creatinine, Ser: 1.14 mg/dL (ref 0.40–1.50)
GFR: 63.37 mL/min (ref >60.00)
Glucose, Bld: 79 mg/dL (ref 70–99)
Potassium: 4.7 meq/L (ref 3.5–5.1)
Sodium: 136 meq/L (ref 135–145)
Total Bilirubin: 0.6 mg/dL (ref 0.2–1.2)
Total Protein: 6.8 g/dL (ref 6.0–8.3)

## 2024-03-04 LAB — LIPID PANEL
Cholesterol: 99 mg/dL (ref 0–200)
HDL: 46.6 mg/dL (ref >39.00)
LDL Cholesterol: 38 mg/dL (ref 0–99)
NonHDL: 52.68
Total CHOL/HDL Ratio: 2
Triglycerides: 71 mg/dL (ref 0.0–149.0)
VLDL: 14.2 mg/dL (ref 0.0–40.0)

## 2024-03-04 LAB — HEMOGLOBIN A1C: Hgb A1c MFr Bld: 6.2 % (ref 4.6–6.5)

## 2024-03-04 LAB — TSH: TSH: 1.9 u[IU]/mL (ref 0.35–5.50)

## 2024-03-04 NOTE — Assessment & Plan Note (Signed)
 Chronic Minimal disease in right internal carotid artery Monitored by cardiology On Zetia  10 mg daily, Crestor  10 mg daily  Lab Results  Component Value Date   LDLCALC 21 02/22/2023

## 2024-03-04 NOTE — Assessment & Plan Note (Signed)
 Chron Lab Results  Component Value Date   HGBA1C 6.2 02/22/2023    Check a1c Low sugar / carb diet Stressed regular exercise

## 2024-03-04 NOTE — Assessment & Plan Note (Addendum)
 Chronic Lab Results  Component Value Date   LDLCALC 21 02/22/2023  LDL at goal Regular exercise and healthy diet  Check lipid panel, cmp, tsh Continue Zetia  10 mg daily, crestor  10 mg daily x 5 days a week

## 2024-03-04 NOTE — Assessment & Plan Note (Signed)
Chronic Following with cardiology No symptoms consistent with angina Aspirin 81 mg daily, Zetia 10 mg daily, crestor 10 mg daily Cbc, cmp, lipid, tsh

## 2024-04-29 DIAGNOSIS — N528 Other male erectile dysfunction: Secondary | ICD-10-CM | POA: Diagnosis not present

## 2024-04-29 DIAGNOSIS — N3943 Post-void dribbling: Secondary | ICD-10-CM | POA: Diagnosis not present

## 2024-04-29 DIAGNOSIS — N401 Enlarged prostate with lower urinary tract symptoms: Secondary | ICD-10-CM | POA: Diagnosis not present

## 2024-10-17 ENCOUNTER — Other Ambulatory Visit: Payer: Self-pay | Admitting: Cardiovascular Disease

## 2024-10-20 MED ORDER — ROSUVASTATIN CALCIUM 10 MG PO TABS: 10.0000 mg | ORAL_TABLET | Freq: Every day | ORAL | 0 refills | Status: AC

## 2024-10-20 MED ORDER — EZETIMIBE 10 MG PO TABS: 10.0000 mg | ORAL_TABLET | Freq: Every day | ORAL | 0 refills | Status: AC

## 2024-11-14 ENCOUNTER — Other Ambulatory Visit: Payer: Self-pay

## 2024-11-14 ENCOUNTER — Ambulatory Visit: Admitting: Physician Assistant

## 2024-11-14 ENCOUNTER — Encounter: Payer: Self-pay | Admitting: Physician Assistant

## 2024-11-14 DIAGNOSIS — M25562 Pain in left knee: Secondary | ICD-10-CM

## 2024-11-14 NOTE — Progress Notes (Signed)
 "  Office Visit Note   Patient: Richard Novak           Date of Birth: 02-Oct-1951           MRN: [id number] Visit Date: 11/14/2024              Requested by: Geofm Glade PARAS, MD [address] [place],  [place] [address] PCP: Geofm Glade PARAS, MD   Assessment & Plan: Visit Diagnoses:  1. Acute pain of left knee     Plan: Patient is a pleasant 74 year old gentleman who comes in today with left lateral posterior knee pain after doing heel raises and the gym.  This happened about a week ago he had a sharp pain.  Area of concern is the proximal tibiofibular ligament.  He has good strength he does not have any ecchymosis or swelling and he actually says this is gotten better in the last week we will begin by treating this conservatively he is to do activity modification.  Avoiding any of his exercises that cause it to be more painful.  He does have a brace that he uses it helps him feel better talked him about using some Voltaren  gel.  Will contact me in the next few weeks if he does not improve  Follow-Up Instructions: Return if symptoms worsen or fail to improve.   Orders:  Orders Placed This Encounter  Procedures   XR KNEE 3 VIEW LEFT   No orders of the defined types were placed in this encounter.     Procedures: No procedures performed   Clinical Data: No additional findings.   Subjective: Chief Complaint  Patient presents with   Left Leg - Injury   Patient presents today for evaluation of left lateral posterior knee pain.  He was doing sitting calf raises at the gym when he felt pulling and pain that was sharp in the lateral aspect of his knee.  He admits it has gotten better.  Denies any ecchymosis any swelling or any instability Injury    Review of Systems  All other systems reviewed and are negative.    Objective: Vital Signs: There were no vitals taken for this visit.  Physical Exam Constitutional:      Appearance: Normal appearance.  Pulmonary:     Effort:  Pulmonary effort is normal.  Skin:    General: Skin is warm and dry.  Neurological:     General: No focal deficit present.     Mental Status: He is alert and oriented to person, place, and time.     Ortho Exam Examination of his knee he has no effusion no erythema no tenderness over the medial lateral joint line no tenderness or laxity with a ACL PCL LCL or MCL.  He is tender over the tibiofibular ligament.  Does not appreciate any instability.  Compartments are soft and nontender is neurovascular intact Specialty Comments:  No specialty comments available.  Imaging: XR KNEE 3 VIEW LEFT Result Date: 11/14/2024 Radiographs of the left knee demonstrate well-maintained alignment no evidence of fracture some degenerative changes    PMFS History: Patient Active Problem List   Diagnosis Date Noted   Carotid artery disease 02/22/2023   CAD (coronary artery disease) 02/09/2021   Skin abnormalities 02/13/2020   COVID-19 09/05/2019   Prediabetes 10/22/2016   Hyperlipidemia 10/19/2016   Olecranon bursitis of left elbow 09/07/2016   History of colonic polyps 03/18/2013   Disorder of bursae and tendons in shoulder region 07/16/2009  History of disease of skin and subcutaneous tissue 07/16/2009   Past Medical History:  Diagnosis Date   Bursitis of elbow    left   Nongonococcal urethritis (NGU) due to other specified organism    Personal history of colonic adenomas 03/18/2013   Rotator cuff injury    Left   Tendinitis of left elbow    Urticaria     Family History  Problem Relation Age of Onset   Hypertension Mother    Dementia Mother    Prostate cancer Father    Coronary artery disease Father    Cancer Father        lung cancer   Heart disease Father        CAD/CABG   Healthy Maternal Grandmother    Testicular cancer Maternal Grandfather    Healthy Paternal Grandmother    Hiatal hernia Paternal Grandfather    Diabetes Neg Hx    Heart attack Neg Hx    Colon cancer Neg Hx     Hyperlipidemia Neg Hx    Rectal cancer Neg Hx    Stomach cancer Neg Hx     Past Surgical History:  Procedure Laterality Date   APPENDECTOMY  1968   CATARACT EXTRACTION, BILATERAL Bilateral    2024   COLONOSCOPY     OLECRANON BURSECTOMY Left 10/04/2016   Procedure: LEFT OLECRANON BURSECTOMY;  Surgeon: Kay CHRISTELLA Cummins, MD;  Location: [facility];  Service: Orthopedics;  Laterality: Left;   ROTATOR CUFF REPAIR  2008   Arthroscopic Repair BUELAH)   Social History   Occupational History   Occupation: Diplomatic services operational officer - engineer, site equip [employer]    Comment: Retired in March 2011    Employer: [employer]   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

## 2024-11-21 ENCOUNTER — Encounter: Payer: Self-pay | Admitting: Physician Assistant

## 2024-11-21 ENCOUNTER — Ambulatory Visit: Admitting: Physician Assistant

## 2024-11-21 ENCOUNTER — Other Ambulatory Visit (INDEPENDENT_AMBULATORY_CARE_PROVIDER_SITE_OTHER): Payer: Self-pay

## 2024-11-21 DIAGNOSIS — M25561 Pain in right knee: Secondary | ICD-10-CM

## 2024-11-21 NOTE — Progress Notes (Signed)
 "  Office Visit Note   Patient: Richard Novak           Date of Birth: 01-29-1951           MRN: [id number] Visit Date: 11/21/2024              Requested by: Geofm Glade PARAS, MD [address] [place],  [place] [address] PCP: Geofm Glade PARAS, MD  No chief complaint on file.     HPI: Patient is a pleasant 74 year old gentleman who have seen in the past comes in today with swelling in the prepatellar bursa on his right knee.  Has not been kneeling on it denies any fever or chills.  He said it is gotten a little bit smaller it was quite big earlier yesterday.  He said this may have started after he bumped his knee Assessment & Plan: Visit Diagnoses:  1. Acute pain of right knee     Plan: On exam there is no evidence of any infective process he does have dry skin but he has not been kneeling on his knee.  He just has bogginess over the prepatellar bursa.  He has said this is gotten smaller.  I encouraged to keep this clean with Dial soap twice a day and to use a mild lotion to soften his skin so as not to get any skin tears.  May follow-up with me has been given information with regards to red flags meaning if it gets red if it starts to drain.  He is well aware of this as he did have a infected olecranon bursa  Follow-Up Instructions: No follow-ups on file.   Ortho Exam  Patient is alert, oriented, no adenopathy, well-dressed, normal affect, normal respiratory effort. Examination of his right knee has no effusion he he has no erythema no cellulitis compartments are soft and nontender no swelling he does have bogginess within the prepatellar bursa but there is no skin breakdown there is no redness there is no tenderness to palpation    Imaging: XR KNEE 3 VIEW RIGHT Result Date: 11/21/2024 I just use of his right knee demonstrate some degenerative changes no acute fractures  No images are attached to the encounter.  Labs: Lab Results  Component Value Date   HGBA1C 6.2 03/04/2024    HGBA1C 6.2 02/22/2023   HGBA1C 6.0 02/17/2022   REPTSTATUS 10/09/2016 FINAL 10/04/2016   GRAMSTAIN  10/04/2016    FEW WBC PRESENT, PREDOMINANTLY PMN RARE GRAM POSITIVE COCCI IN PAIRS    CULT FEW STAPHYLOCOCCUS AUREUS NO ANAEROBES ISOLATED  10/04/2016   LABORGA STAPHYLOCOCCUS AUREUS 10/04/2016     Lab Results  Component Value Date   ALBUMIN 4.4 03/04/2024   ALBUMIN 4.3 02/22/2023   ALBUMIN 4.3 11/24/2022    No results found for: MG No results found for: VD25OH  No results found for: PREALBUMIN    Latest Ref Rng & Units 03/04/2024    2:18 PM 11/24/2022   11:27 AM 02/17/2022   10:44 AM  CBC EXTENDED  WBC 4.0 - 10.5 K/uL 6.3  8.8  6.7   RBC 4.22 - 5.81 Mil/uL 4.65  4.58  4.51   Hemoglobin 13.0 - 17.0 g/dL 85.3  85.3  85.5   HCT 39.0 - 52.0 % 44.0  43.2  42.3   Platelets 150.0 - 400.0 K/uL 203.0  222.0  206.0   NEUT# 1.4 - 7.7 K/uL 3.7  6.0  4.2   Lymph# 0.7 - 4.0 K/uL 1.7  2.1  1.7      There is no height or weight on file to calculate BMI.  Orders:  Orders Placed This Encounter  Procedures   XR KNEE 3 VIEW RIGHT   No orders of the defined types were placed in this encounter.    Procedures: No procedures performed  Clinical Data: No additional findings.  ROS:  All other systems negative, except as noted in the HPI. Review of Systems  Objective: Vital Signs: There were no vitals taken for this visit.  Specialty Comments:  No specialty comments available.  PMFS History: Patient Active Problem List   Diagnosis Date Noted   Carotid artery disease 02/22/2023   CAD (coronary artery disease) 02/09/2021   Skin abnormalities 02/13/2020   COVID-19 09/05/2019   Prediabetes 10/22/2016   Hyperlipidemia 10/19/2016   Olecranon bursitis of left elbow 09/07/2016   History of colonic polyps 03/18/2013   Disorder of bursae and tendons in shoulder region 07/16/2009   History of disease of skin and subcutaneous tissue 07/16/2009   Past Medical History:   Diagnosis Date   Bursitis of elbow    left   Nongonococcal urethritis (NGU) due to other specified organism    Personal history of colonic adenomas 03/18/2013   Rotator cuff injury    Left   Tendinitis of left elbow    Urticaria     Family History  Problem Relation Age of Onset   Hypertension Mother    Dementia Mother    Prostate cancer Father    Coronary artery disease Father    Cancer Father        lung cancer   Heart disease Father        CAD/CABG   Healthy Maternal Grandmother    Testicular cancer Maternal Grandfather    Healthy Paternal Grandmother    Hiatal hernia Paternal Grandfather    Diabetes Neg Hx    Heart attack Neg Hx    Colon cancer Neg Hx    Hyperlipidemia Neg Hx    Rectal cancer Neg Hx    Stomach cancer Neg Hx     Past Surgical History:  Procedure Laterality Date   APPENDECTOMY  1968   CATARACT EXTRACTION, BILATERAL Bilateral    2024   COLONOSCOPY     OLECRANON BURSECTOMY Left 10/04/2016   Procedure: LEFT OLECRANON BURSECTOMY;  Surgeon: Kay CHRISTELLA Cummins, MD;  Location: [facility];  Service: Orthopedics;  Laterality: Left;   ROTATOR CUFF REPAIR  2008   Arthroscopic Repair BUELAH)   Social History   Occupational History   Occupation: Diplomatic services operational officer - engineer, site equip [employer]    Comment: Retired in March 2011    Employer: [employer]   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

## 2025-03-05 ENCOUNTER — Encounter: Admitting: Internal Medicine

## 2025-03-05 ENCOUNTER — Ambulatory Visit
